# Patient Record
Sex: Female | Born: 1985 | Hispanic: Yes | Smoking: Current every day smoker
Health system: Southern US, Community
[De-identification: ages and names within clinical notes are randomized; demographics above are authoritative.]

## PROBLEM LIST (undated history)

## (undated) DIAGNOSIS — F419 Anxiety disorder, unspecified: Secondary | ICD-10-CM

## (undated) DIAGNOSIS — F319 Bipolar disorder, unspecified: Secondary | ICD-10-CM

## (undated) DIAGNOSIS — O99345 Other mental disorders complicating the puerperium: Secondary | ICD-10-CM

## (undated) DIAGNOSIS — F909 Attention-deficit hyperactivity disorder, unspecified type: Secondary | ICD-10-CM

## (undated) DIAGNOSIS — E0590 Thyrotoxicosis, unspecified without thyrotoxic crisis or storm: Secondary | ICD-10-CM

## (undated) DIAGNOSIS — F53 Postpartum depression: Secondary | ICD-10-CM

## (undated) DIAGNOSIS — F329 Major depressive disorder, single episode, unspecified: Secondary | ICD-10-CM

## (undated) DIAGNOSIS — E039 Hypothyroidism, unspecified: Secondary | ICD-10-CM

## (undated) DIAGNOSIS — F32A Depression, unspecified: Secondary | ICD-10-CM

## (undated) HISTORY — DX: Bipolar disorder, unspecified: F31.9

## (undated) HISTORY — DX: Major depressive disorder, single episode, unspecified: F32.9

## (undated) HISTORY — PX: COSMETIC SURGERY: SHX468

## (undated) HISTORY — PX: RHINOPLASTY: SUR1284

## (undated) HISTORY — PX: ADENOIDECTOMY: SUR15

## (undated) HISTORY — DX: Postpartum depression: F53.0

## (undated) HISTORY — DX: Attention-deficit hyperactivity disorder, unspecified type: F90.9

## (undated) HISTORY — DX: Depression, unspecified: F32.A

## (undated) HISTORY — DX: Other mental disorders complicating the puerperium: O99.345

## (undated) HISTORY — DX: Hypothyroidism, unspecified: E03.9

## (undated) HISTORY — DX: Anxiety disorder, unspecified: F41.9

## (undated) HISTORY — DX: Thyrotoxicosis, unspecified without thyrotoxic crisis or storm: E05.90

---

## 2014-06-13 ENCOUNTER — Encounter: Payer: Self-pay | Admitting: Obstetrics & Gynecology

## 2014-06-20 ENCOUNTER — Encounter: Payer: Self-pay | Admitting: Obstetrics & Gynecology

## 2014-08-26 ENCOUNTER — Encounter: Payer: Self-pay | Admitting: Obstetrics & Gynecology

## 2014-08-26 ENCOUNTER — Ambulatory Visit (INDEPENDENT_AMBULATORY_CARE_PROVIDER_SITE_OTHER): Payer: BLUE CROSS/BLUE SHIELD | Admitting: Obstetrics & Gynecology

## 2014-08-26 VITALS — BP 116/77 | HR 77 | Resp 16 | Ht 63.0 in | Wt 172.0 lb

## 2014-08-26 DIAGNOSIS — Z01419 Encounter for gynecological examination (general) (routine) without abnormal findings: Secondary | ICD-10-CM | POA: Diagnosis not present

## 2014-08-26 DIAGNOSIS — Z113 Encounter for screening for infections with a predominantly sexual mode of transmission: Secondary | ICD-10-CM

## 2014-08-26 DIAGNOSIS — Z3200 Encounter for pregnancy test, result unknown: Secondary | ICD-10-CM

## 2014-08-26 DIAGNOSIS — Z124 Encounter for screening for malignant neoplasm of cervix: Secondary | ICD-10-CM

## 2014-08-26 DIAGNOSIS — Z1151 Encounter for screening for human papillomavirus (HPV): Secondary | ICD-10-CM | POA: Diagnosis not present

## 2014-08-26 DIAGNOSIS — Z0000 Encounter for general adult medical examination without abnormal findings: Secondary | ICD-10-CM

## 2014-08-26 DIAGNOSIS — N910 Primary amenorrhea: Secondary | ICD-10-CM

## 2014-08-26 LAB — POCT URINE PREGNANCY: Preg Test, Ur: NEGATIVE

## 2014-08-26 NOTE — Progress Notes (Signed)
Subjective:    Isabella Marks is a 29 y.o. engaged P65 (90 yo biological son and 1 stepson) female who presents for an annual exam. The patient has no complaints today except that she has been trying to conceive for a year with no pregnancy. Her fiance has never fathered any children. The patient is sexually active. GYN screening history: last pap: was normal. The patient wears seatbelts: yes. The patient participates in regular exercise: yes. Has the patient ever been transfused or tattooed?: yes. The patient reports that there is not domestic violence in her life.   Menstrual History: OB History    Gravida Para Term Preterm AB TAB SAB Ectopic Multiple Living   1 1 1       1       Menarche age: 44  Patient's last menstrual period was 07/16/2014.    The following portions of the patient's history were reviewed and updated as appropriate: allergies, current medications, past family history, past medical history, past social history, past surgical history and problem list.  Review of Systems A comprehensive review of systems was negative. She is a Agricultural engineer, previous Company secretary. Denies STIs.   Objective:    BP 116/77 mmHg  Pulse 77  Resp 16  Ht 5\' 3"  (1.6 m)  Wt 172 lb (78.019 kg)  BMI 30.48 kg/m2  LMP 07/16/2014  General Appearance:    Alert, cooperative, no distress, appears stated age  Head:    Normocephalic, without obvious abnormality, atraumatic  Eyes:    PERRL, conjunctiva/corneas clear, EOM's intact, fundi    benign, both eyes  Ears:    Normal TM's and external ear canals, both ears  Nose:   Nares normal, septum midline, mucosa normal, no drainage    or sinus tenderness  Throat:   Lips, mucosa, and tongue normal; teeth and gums normal  Neck:   Supple, symmetrical, trachea midline, no adenopathy;    thyroid:  no enlargement/tenderness/nodules; no carotid   bruit or JVD  Back:     Symmetric, no curvature, ROM normal, no CVA tenderness  Lungs:     Clear to auscultation bilaterally,  respirations unlabored  Chest Wall:    No tenderness or deformity   Heart:    Regular rate and rhythm, S1 and S2 normal, no murmur, rub   or gallop  Breast Exam:    No tenderness, masses, or nipple abnormality  Abdomen:     Soft, non-tender, bowel sounds active all four quadrants,    no masses, no organomegaly  Genitalia:    Normal female without lesion, discharge or tenderness, slightly enlarged, NT, mobile, normal adnexal exam     Extremities:   Extremities normal, atraumatic, no cyanosis or edema  Pulses:   2+ and symmetric all extremities  Skin:   Skin color, texture, turgor normal, no rashes or lesions  Lymph nodes:   Cervical, supraclavicular, and axillary nodes normal  Neurologic:   CNII-XII intact, normal strength, sensation and reflexes    throughout  .    Assessment:    Healthy female exam.   ? Faintly positive UPT along with late period and slightly enlarged uterus   Plan:     Chlamydia specimen. GC specimen. Thin prep Pap smear. QBHCG   Rec MVI daily

## 2014-08-27 LAB — HCG, QUANTITATIVE, PREGNANCY: hCG, Beta Chain, Quant, S: <2 m[IU]/mL

## 2014-08-29 LAB — CYTOLOGY - PAP

## 2015-01-30 ENCOUNTER — Encounter: Payer: Self-pay | Admitting: Obstetrics & Gynecology

## 2015-01-30 ENCOUNTER — Ambulatory Visit (INDEPENDENT_AMBULATORY_CARE_PROVIDER_SITE_OTHER): Payer: Commercial Managed Care - HMO | Admitting: Obstetrics & Gynecology

## 2015-01-30 VITALS — BP 126/88 | HR 107 | Ht 63.0 in | Wt 183.0 lb

## 2015-01-30 DIAGNOSIS — N912 Amenorrhea, unspecified: Secondary | ICD-10-CM

## 2015-01-30 DIAGNOSIS — Z23 Encounter for immunization: Secondary | ICD-10-CM

## 2015-01-30 MED ORDER — MEDROXYPROGESTERONE ACETATE 10 MG PO TABS: 10.0000 mg | ORAL_TABLET | Freq: Every day | ORAL | Status: DC

## 2015-01-30 NOTE — Progress Notes (Signed)
   Subjective:    Patient ID: Isabella Marks, female    DOB: [birth year], 29 y.o.   MRN: [id number]  HPI 29 yo engaged young woman who was here for an annual exam 4/16. She wants to conceive but started lithium June 2016. LMP was March 2016. She usually has regular monthly periods. She has taken UPTs at home and they are negative.   Review of Systems     Objective:   Physical Exam [id number] Breathing, conversing, and ambulating normally       Assessment & Plan:  Amenorrhea for 3 months- check TSH, prolactin, and give a trial of provera cyclic Rec MVI We discussed that Lithium is Category D in pregnancy (risks of heart defects) Flu vaccine today

## 2015-01-31 LAB — TSH: TSH: 2.584 u[IU]/mL (ref 0.350–4.500)

## 2015-01-31 LAB — PROLACTIN: Prolactin: 5.3 ng/mL

## 2015-04-01 ENCOUNTER — Ambulatory Visit: Payer: Commercial Managed Care - HMO | Admitting: Obstetrics & Gynecology

## 2015-07-09 ENCOUNTER — Ambulatory Visit (INDEPENDENT_AMBULATORY_CARE_PROVIDER_SITE_OTHER): Payer: Commercial Managed Care - HMO | Admitting: Obstetrics & Gynecology

## 2015-07-09 ENCOUNTER — Encounter: Payer: Self-pay | Admitting: Osteopathic Medicine

## 2015-07-09 ENCOUNTER — Ambulatory Visit (INDEPENDENT_AMBULATORY_CARE_PROVIDER_SITE_OTHER): Payer: Commercial Managed Care - HMO | Admitting: Osteopathic Medicine

## 2015-07-09 ENCOUNTER — Encounter: Payer: Self-pay | Admitting: Obstetrics & Gynecology

## 2015-07-09 VITALS — BP 118/69 | HR 79 | Temp 98.3°F | Ht 63.0 in | Wt 183.0 lb

## 2015-07-09 VITALS — BP 108/71 | HR 79 | Temp 97.2°F | Resp 16 | Ht 63.0 in | Wt 182.0 lb

## 2015-07-09 DIAGNOSIS — R05 Cough: Secondary | ICD-10-CM | POA: Diagnosis not present

## 2015-07-09 DIAGNOSIS — J019 Acute sinusitis, unspecified: Secondary | ICD-10-CM

## 2015-07-09 DIAGNOSIS — E079 Disorder of thyroid, unspecified: Secondary | ICD-10-CM | POA: Insufficient documentation

## 2015-07-09 DIAGNOSIS — N644 Mastodynia: Secondary | ICD-10-CM | POA: Diagnosis not present

## 2015-07-09 DIAGNOSIS — F319 Bipolar disorder, unspecified: Secondary | ICD-10-CM | POA: Insufficient documentation

## 2015-07-09 DIAGNOSIS — Z0000 Encounter for general adult medical examination without abnormal findings: Secondary | ICD-10-CM

## 2015-07-09 DIAGNOSIS — F17200 Nicotine dependence, unspecified, uncomplicated: Secondary | ICD-10-CM | POA: Insufficient documentation

## 2015-07-09 DIAGNOSIS — F909 Attention-deficit hyperactivity disorder, unspecified type: Secondary | ICD-10-CM | POA: Insufficient documentation

## 2015-07-09 DIAGNOSIS — R059 Cough, unspecified: Secondary | ICD-10-CM

## 2015-07-09 DIAGNOSIS — H6983 Other specified disorders of Eustachian tube, bilateral: Secondary | ICD-10-CM

## 2015-07-09 DIAGNOSIS — E039 Hypothyroidism, unspecified: Secondary | ICD-10-CM | POA: Insufficient documentation

## 2015-07-09 MED ORDER — AMOXICILLIN-POT CLAVULANATE 875-125 MG PO TABS: 1.0000 | ORAL_TABLET | Freq: Two times a day (BID) | ORAL | Status: DC

## 2015-07-09 MED ORDER — IPRATROPIUM BROMIDE 0.03 % NA SOLN: 2.0000 | Freq: Two times a day (BID) | NASAL | Status: DC

## 2015-07-09 MED ORDER — BENZONATATE 200 MG PO CAPS: 200.0000 mg | ORAL_CAPSULE | Freq: Three times a day (TID) | ORAL | Status: DC | PRN

## 2015-07-09 NOTE — Patient Instructions (Signed)

## 2015-07-09 NOTE — Progress Notes (Signed)
HPI: Isabella Marks is a 30 y.o. female who presents to [facility]  today for chief complaint of:  Chief Complaint  Patient presents with  . Establish Care    "i've been sick for 2 months"   ILLNESS . Location: sinus . Quality: coughing, dizziness . Assoc signs/symptoms: fever temp low 100's just started with cough and runny nose . Duration: 2 months . Modifying factors: has tried the following OTC/Rx medications: Robitussin, Delsym, Aleve  without relief . Context: LAST MONTH HAD FREQUENT GI PROBLEMS, THROWING UP AND DIARRHEA, PAST FEW WEEKS BAD COUGH, RUNNY NOSE, HEADACHES, FEVER, DIZZINESS.   OTHER MEDICAL ISSUES - BRIEF REVIEW Under care of psychiatry Dr. Elaina Hoops MD [phone] - well-controlled   Past Medical History  Diagnosis Date  . Bipolar 1 disorder ([place])   . Depression   . Postpartum depression   . ADHD (attention deficit hyperactivity disorder)   . Hyperthyroidism   . Hypothyroid    Outpatient Encounter Prescriptions as of 07/09/2015  Medication Sig Note  . amphetamine-dextroamphetamine (ADDERALL) 30 MG tablet Take 1 tablet by mouth 2 (two) times daily. 08/26/2014: Received from: External Pharmacy  . FLUoxetine (PROZAC) 20 MG tablet Take 20 mg by mouth daily.   Marland Kitchen lithium 300 MG tablet  08/26/2014: Received from: External Pharmacy  . lithium carbonate 300 MG capsule  08/26/2014: Received from: External Pharmacy  . zolpidem (AMBIEN) 10 MG tablet Take 10 mg by mouth at bedtime as needed for sleep.    No facility-administered encounter medications on file as of 07/09/2015.   Family History  Problem Relation Age of Onset  . Breast cancer Paternal Grandmother   . Breast cancer Maternal Aunt   . Lupus Maternal Aunt   . Diabetes Paternal Aunt   . Hypertension Father    Past Surgical History  Procedure Laterality Date  . Rhinoplasty    . Adenoidectomy     Social History   Social History  . Marital Status: Single    Spouse  Name: N/A  . Number of Children: N/A  . Years of Education: N/A   Occupational History  . Not on file.   Social History Main Topics  . Smoking status: Current Every Day Smoker -- 0.50 packs/day for 3 years    Types: Cigarettes  . Smokeless tobacco: Never Used  . Alcohol Use: 0.0 oz/week    0 Standard drinks or equivalent per week     Comment: Occasional  . Drug Use: No  . Sexual Activity:    Partners: Male   Other Topics Concern  . Not on file   Social History Narrative     Review of Systems: CONSTITUTIONAL:  No  fever, no chills, No  unintentional weight changes, (+) generalized weakness/fatigue HEAD/EYES/EARS/NOSE/THROAT: No  headache, no vision change, (+) hearing change, No  sore throat, No  sinus pressure, (+) Hay fever/allergies CARDIAC: No  chest pain, No  pressure, No palpitations, No  orthopnea RESPIRATORY: (+)  cough, No  shortness of breath/wheeze GASTROINTESTINAL: No  nausea, No  vomiting, No  abdominal pain, No  blood in stool, No  diarrhea, No  constipation  MUSCULOSKELETAL: No  myalgia/arthralgia GENITOURINARY: No  incontinence, No  abnormal genital bleeding/discharge SKIN: (+) rash/wounds/concerning lesions HEM/ONC: No  easy bruising/bleeding, No  abnormal lymph node ENDOCRINE: No polyuria/polydipsia/polyphagia, No  heat/cold intolerance  NEUROLOGIC: No  weakness, No  dizziness, No  slurred speech PSYCHIATRIC: (+) concerns with depression, (+) concerns with anxiety, (+) sleep problems, PHQ2 (+)  Exam:  BP 118/69 mmHg  Pulse 79  Temp(Src) 98.3 F (36.8 C) (Oral)  Ht 5\' 3"  (1.6 m)  Wt 183 lb (83.008 kg)  BMI 32.43 kg/m2  LMP 06/22/2015 Constitutional: VSS, see above. General Appearance: alert, well-developed, well-nourished, NAD Eyes: Normal lids and conjunctive, non-icteric sclera, PERRLA Ears, Nose, Mouth, Throat: Normal external inspection ears/nares/mouth/lips/gums, mild clear effusion b/l but worse on L TM, MMM; posterior pharynx with  erythema, without exudate, (+) nasal congestion and rhinorrhea Neck: No masses, trachea midline. No thyroid enlargement/tenderness/mass appreciated, normal lymph nodes Respiratory: Normal respiratory effort. No  wheeze/rhonchi/rales Cardiovascular: S1/S2 normal, no murmur/rub/gallop auscultated. RRR.       No lower extremity edema.    No results found for this or any previous visit (from the past 72 hour(s)). No results found.   ASSESSMENT/PLAN:   Acute sinusitis, recurrence not specified, unspecified location - Plan: amoxicillin-clavulanate (AUGMENTIN) 875-125 MG tablet, ipratropium (ATROVENT) 0.03 % nasal spray  Cough - likely postnasal drip - Plan: benzonatate (TESSALON) 200 MG capsule  Tobacco dependence  Eustachian tube dysfunction, bilateral - likely cause of dizziness due to sinus congestion/sinusitis - treat the cause, pt to alert me if no better on 1 week treatment   Return if symptoms worsen or fail to improve, and when due for annual wellness/physical.

## 2015-07-09 NOTE — Progress Notes (Signed)
   Subjective:    Patient ID: Isabella Marks, female    DOB: 02-Aug-1985, 30 y.o.   MRN: [id number]  HPI 30 yo W G0 here with some intermittent left breast pain (lower area) with a questionable mass.  Review of Systems She did get a period after the provera and is having a period every other month now spontaneously.    Objective:   Physical Exam WWHWFNAD Breathing, conversing normally Both breasts are entirely normal, dense tissue bilaterally and symmetrically       Assessment & Plan:  Reassurance given I have referred her to a fam md for general medical care

## 2018-11-29 LAB — RESULTS CONSOLE HPV: CHL HPV: NEGATIVE

## 2018-11-29 LAB — HM PAP SMEAR

## 2020-10-30 ENCOUNTER — Inpatient Hospital Stay (HOSPITAL_COMMUNITY)
Admission: AD | Admit: 2020-10-30 | Discharge: 2020-10-30 | Disposition: A | Discharge status: Home / Self Care | Payer: Managed Care, Other (non HMO) | Attending: Obstetrics and Gynecology | Admitting: Obstetrics and Gynecology

## 2020-10-30 ENCOUNTER — Ambulatory Visit: Payer: Managed Care, Other (non HMO) | Admitting: Obstetrics and Gynecology

## 2020-10-30 ENCOUNTER — Other Ambulatory Visit: Payer: Self-pay

## 2020-10-30 ENCOUNTER — Inpatient Hospital Stay (HOSPITAL_COMMUNITY): Payer: Managed Care, Other (non HMO)

## 2020-10-30 ENCOUNTER — Encounter: Payer: Self-pay | Admitting: Obstetrics and Gynecology

## 2020-10-30 ENCOUNTER — Encounter (HOSPITAL_COMMUNITY): Payer: Self-pay | Admitting: Obstetrics and Gynecology

## 2020-10-30 VITALS — BP 108/62 | HR 93 | Resp 16 | Ht 63.0 in | Wt 162.0 lb

## 2020-10-30 DIAGNOSIS — O26891 Other specified pregnancy related conditions, first trimester: Secondary | ICD-10-CM | POA: Diagnosis not present

## 2020-10-30 DIAGNOSIS — O209 Hemorrhage in early pregnancy, unspecified: Secondary | ICD-10-CM | POA: Diagnosis present

## 2020-10-30 DIAGNOSIS — R1031 Right lower quadrant pain: Secondary | ICD-10-CM | POA: Diagnosis not present

## 2020-10-30 DIAGNOSIS — O3680X0 Pregnancy with inconclusive fetal viability, not applicable or unspecified: Secondary | ICD-10-CM

## 2020-10-30 DIAGNOSIS — R102 Pelvic and perineal pain: Secondary | ICD-10-CM | POA: Diagnosis not present

## 2020-10-30 DIAGNOSIS — Z3201 Encounter for pregnancy test, result positive: Secondary | ICD-10-CM | POA: Diagnosis not present

## 2020-10-30 DIAGNOSIS — O26899 Other specified pregnancy related conditions, unspecified trimester: Secondary | ICD-10-CM

## 2020-10-30 DIAGNOSIS — F17210 Nicotine dependence, cigarettes, uncomplicated: Secondary | ICD-10-CM | POA: Diagnosis not present

## 2020-10-30 DIAGNOSIS — N939 Abnormal uterine and vaginal bleeding, unspecified: Secondary | ICD-10-CM

## 2020-10-30 DIAGNOSIS — O4691 Antepartum hemorrhage, unspecified, first trimester: Secondary | ICD-10-CM

## 2020-10-30 DIAGNOSIS — Z20822 Contact with and (suspected) exposure to covid-19: Secondary | ICD-10-CM | POA: Diagnosis not present

## 2020-10-30 DIAGNOSIS — Z3200 Encounter for pregnancy test, result unknown: Secondary | ICD-10-CM

## 2020-10-30 DIAGNOSIS — O99331 Smoking (tobacco) complicating pregnancy, first trimester: Secondary | ICD-10-CM | POA: Diagnosis not present

## 2020-10-30 DIAGNOSIS — Z3A01 Less than 8 weeks gestation of pregnancy: Secondary | ICD-10-CM | POA: Diagnosis not present

## 2020-10-30 LAB — COMPREHENSIVE METABOLIC PANEL
ALT: 50 U/L — ABNORMAL HIGH (ref 0–44)
AST: 43 U/L — ABNORMAL HIGH (ref 15–41)
Albumin: 3.9 g/dL (ref 3.5–5.0)
Alkaline Phosphatase: 63 U/L (ref 38–126)
Anion gap: 4 — ABNORMAL LOW (ref 5–15)
BUN: 8 mg/dL (ref 6–20)
CO2: 24 mmol/L (ref 22–32)
Calcium: 8.9 mg/dL (ref 8.9–10.3)
Chloride: 107 mmol/L (ref 98–111)
Creatinine, Ser: 0.71 mg/dL (ref 0.44–1.00)
GFR, Estimated: >60 mL/min (ref >60)
Glucose, Bld: 109 mg/dL — ABNORMAL HIGH (ref 70–99)
Potassium: 4.1 mmol/L (ref 3.5–5.1)
Sodium: 135 mmol/L (ref 135–145)
Total Bilirubin: 0.7 mg/dL (ref 0.3–1.2)
Total Protein: 6.7 g/dL (ref 6.5–8.1)

## 2020-10-30 LAB — CBC
HCT: 41.9 % (ref 36.0–46.0)
Hemoglobin: 14.1 g/dL (ref 12.0–15.0)
MCH: 31.5 pg (ref 26.0–34.0)
MCHC: 33.7 g/dL (ref 30.0–36.0)
MCV: 93.7 fL (ref 80.0–100.0)
Platelets: 200 10*3/uL (ref 150–400)
RBC: 4.47 MIL/uL (ref 3.87–5.11)
RDW: 12.3 % (ref 11.5–15.5)
WBC: 8.6 10*3/uL (ref 4.0–10.5)
nRBC: 0 % (ref 0.0–0.2)

## 2020-10-30 LAB — POCT URINE PREGNANCY: Preg Test, Ur: POSITIVE — AB

## 2020-10-30 LAB — TYPE AND SCREEN
ABO/RH(D): O POS
Antibody Screen: NEGATIVE

## 2020-10-30 LAB — RESP PANEL BY RT-PCR (FLU A&B, COVID) ARPGX2
Influenza A by PCR: NEGATIVE
Influenza B by PCR: NEGATIVE
SARS Coronavirus 2 by RT PCR: NEGATIVE

## 2020-10-30 LAB — HCG, QUANTITATIVE, PREGNANCY: hCG, Beta Chain, Quant, S: 849 m[IU]/mL — ABNORMAL HIGH (ref <5)

## 2020-10-30 MED ORDER — LACTATED RINGERS IV BOLUS
1000.0000 mL | Freq: Once | INTRAVENOUS | Status: AC
  Administered 2020-10-30: 1000 mL via INTRAVENOUS

## 2020-10-30 MED ORDER — MORPHINE SULFATE (PF) 4 MG/ML IV SOLN
2.0000 mg | Freq: Once | INTRAVENOUS | Status: AC
Start: 2020-10-30 — End: 2020-10-30
  Administered 2020-10-30: 2 mg via INTRAVENOUS
  Filled 2020-10-30: qty 1

## 2020-10-30 NOTE — Progress Notes (Signed)
GYNECOLOGY OFFICE VISIT NOTE  History:  35 y.o. G1P1001 here today for abnormal uterine bleeding. She has been bleeding for 3 weeks, intermittently brown and red, will get lighter and heavier. She skipped her period last month. Has not taken a pregnancy test. She has irregular periods normally, and it is not uncommon for her to skip periods. She then had severe, 10/10 right lower quadrant pain 2 days ago that improved on its own and has been waxing and waning since but decided she should be seen. Today, rates it 8/10. Did not go to ED/urgent care because it improved on its own. Today, she describes it as "pulsating" and can feel the pain down her right leg.   Not on contraception as she and partner would like to get pregnant. Had CS 11 years ago.  Past Medical History:  Diagnosis Date   ADHD (attention deficit hyperactivity disorder)    Bipolar 1 disorder ([place])    Depression    Hyperthyroidism    Hypothyroid    Postpartum depression     Past Surgical History:  Procedure Laterality Date   ADENOIDECTOMY     RHINOPLASTY       Current Outpatient Medications:    amphetamine-dextroamphetamine (ADDERALL) 30 MG tablet, Take 1 tablet by mouth 2 (two) times daily., Disp: , Rfl: 0   lithium carbonate (ESKALITH) 450 MG CR tablet, TAKE 2 TABLETS BY MOUTH EVERY DAY AT BEDTIME, Disp: , Rfl:    zolpidem (AMBIEN) 10 MG tablet, Take 10 mg by mouth at bedtime as needed for sleep., Disp: , Rfl:   The following portions of the patient's history were reviewed and updated as appropriate: allergies, current medications, past family history, past medical history, past social history, past surgical history and problem list.   Review of Systems:  Pertinent items noted in HPI and remainder of comprehensive ROS otherwise negative.   Objective:  Physical Exam BP 108/62   Pulse 93   Resp 16   Ht 5\' 3"  (1.6 m)   Wt 162 lb (73.5 kg)   BMI 28.70 kg/m  CONSTITUTIONAL: Well-developed, well-nourished  female in no acute distress.  HENT:  Normocephalic, atraumatic. External right and left ear normal. Oropharynx is clear and moist EYES: Conjunctivae and EOM are normal. Pupils are equal, round, and reactive to light. No scleral icterus.  NECK: Normal range of motion, supple, no masses SKIN: Skin is warm and dry. No rash noted. Not diaphoretic. No erythema. No pallor. NEUROLOGIC: Alert and oriented to person, place, and time. Normal reflexes, muscle tone coordination. No cranial nerve deficit noted. PSYCHIATRIC: Normal mood and affect. Normal behavior. Normal judgment and thought content. CARDIOVASCULAR: Normal heart rate noted RESPIRATORY: Effort normal, no problems with respiration noted ABDOMEN: Soft, no distention noted. Moderately tender in RLQ   PELVIC: deferred MUSCULOSKELETAL: Normal range of motion. No edema noted.  Labs and Imaging UPT: positive  Assessment & Plan:   1. Unconfirmed pregnancy - informed patient of positive UPT, she had not taken a pregnancy test at home - concern for ectopic given ongoing bleeding and new onset, severe RLQ pain for the last two days - recommend immediate workup in MAU for rule out ectopic - reviewed ectopic pregnancy, prognosis, possibility of life-threatening complications, need for treatment and/or surgery if confirmed - reviewed may be early normal pregnancy as well but need workup to confirm - patient understandably emotional and verbalizes understanding of the above, is in agreement to go to MAU now - MAU staff aware - POCT  urine pregnancy  2. Pelvic pain  3. Abnormal uterine bleeding (AUB)   Routine preventative health maintenance measures emphasized. Please refer to After Visit Summary for other counseling recommendations.   Return if symptoms worsen or fail to improve.   Feliz Beam, MD, [place] for [employer] [facility])

## 2020-10-30 NOTE — MAU Provider Note (Signed)
History     [id number]  Arrival date and time: 10/30/20 1006    Chief Complaint  Patient presents with   Vaginal Bleeding     HPI Isabella Marks is a 35 y.o. at [redacted]w[redacted]d by uncertain LMP with PMHx notable for bipolar currently lithium, who presents for vaginal bleeding and abdominal pain in the setting of a positive pregnancy test.   Patient sent from [facility] where she was seen by Dr. Rosana Hoes earlier today. I received sign out from Dr. Rosana Hoes prior to her arrival.  Patient reports she has an irregular period and is unsure when her last true menses was. About three weeks ago she began having vaginal bleeding, at times it was heavy with large plum sized clots. Yesterday she developed severe RLQ abdominal pain so she made an appointment with [facility] today. She endorses having some nausea but has not had emesis. No fevers. Nothing has made pain better. Endorses some vaginal discharge, denies burning or pain with urination.    --/--/O POS (06/16 1037)  OB History     Gravida  2   Para  1   Term  1   Preterm      AB      Living  1      SAB      IAB      Ectopic      Multiple      Live Births  1           Past Medical History:  Diagnosis Date   ADHD (attention deficit hyperactivity disorder)    Bipolar 1 disorder ([facility])    Depression    Hyperthyroidism    Hypothyroid    Postpartum depression     Past Surgical History:  Procedure Laterality Date   ADENOIDECTOMY     RHINOPLASTY      Family History  Problem Relation Age of Onset   Breast cancer Paternal Grandmother    Breast cancer Maternal Aunt    Lupus Maternal Aunt    Diabetes Paternal Aunt    Hypertension Father     Social History   Socioeconomic History   Marital status: Single    Spouse name: Not on file   Number of children: Not on file   Years of education: Not on file   Highest education level: Not on file  Occupational History   Not on file  Tobacco Use   Smoking status: Every Day     Packs/day: 0.50    Years: 3.00    Pack years: 1.50    Types: Cigarettes   Smokeless tobacco: Never  Vaping Use   Vaping Use: Never used  Substance and Sexual Activity   Alcohol use: Not Currently    Comment: Occasional   Drug use: No   Sexual activity: Yes    Partners: Male    Birth control/protection: None  Other Topics Concern   Not on file  Social History Narrative   Not on file   Social Determinants of Health   Financial Resource Strain: Not on file  Food Insecurity: Not on file  Transportation Needs: Not on file  Physical Activity: Not on file  Stress: Not on file  Social Connections: Not on file  Intimate Partner Violence: Not on file    No Known Allergies  No current facility-administered medications on file prior to encounter.   Current Outpatient Medications on File Prior to Encounter  Medication Sig Dispense Refill   amphetamine-dextroamphetamine (ADDERALL) 30 MG tablet Take 1  tablet by mouth 2 (two) times daily.  0   lithium carbonate (ESKALITH) 450 MG CR tablet TAKE 2 TABLETS BY MOUTH EVERY DAY AT BEDTIME     zolpidem (AMBIEN) 10 MG tablet Take 10 mg by mouth at bedtime as needed for sleep.       ROS Pertinent positives and negative per HPI, all others reviewed and negative  Physical Exam   BP 124/65   Pulse 92   Temp 98.6 F (37 C)   Resp 18   Ht 5\' 3"  (1.6 m)   Wt 73.5 kg   LMP 09/11/2020 (Within Weeks)   BMI 28.70 kg/m   Patient Vitals for the past 24 hrs:  BP Temp Pulse Resp Height Weight  10/30/20 1307 124/65 -- 92 -- -- --  10/30/20 1138 117/69 -- 90 -- -- --  10/30/20 1024 117/65 98.6 F (37 C) 68 18 5\' 3"  (1.6 m) 73.5 kg    Physical Exam Vitals reviewed.  Constitutional:      General: She is not in acute distress.    Appearance: She is well-developed. She is not diaphoretic.  Eyes:     General: No scleral icterus. Pulmonary:     Effort: Pulmonary effort is normal. No respiratory distress.  Abdominal:     General:  There is no distension.     Palpations: Abdomen is soft.     Tenderness: There is abdominal tenderness. There is no guarding or rebound.     Comments: Mild ttp in RLQ without guarding or rebound  Skin:    General: Skin is warm and dry.  Neurological:     Mental Status: She is alert.     Coordination: Coordination normal.     Cervical Exam    Bedside Ultrasound Pt informed that the ultrasound is considered a limited OB ultrasound and is not intended to be a complete ultrasound exam.  Patient also informed that the ultrasound is not being completed with the intent of assessing for fetal or placental anomalies or any pelvic abnormalities.  Explained that the purpose of today's ultrasound is to assess for  viability.  Patient acknowledges the purpose of the exam and the limitations of the study.   My interpretation: On transabdominal bedside US no intrauterine structure seen. Left adnexa appears normal. Tubular mass present in area of R adnexa c/f ectopic.  FHT N/a  Labs Results for orders placed or performed during the hospital encounter of 10/30/20 (from the past 24 hour(s))  CBC     Status: None   Collection Time: 10/30/20 10:37 AM  Result Value Ref Range   WBC 8.6 4.0 - 10.5 K/uL   RBC 4.47 3.87 - 5.11 MIL/uL   Hemoglobin 14.1 12.0 - 15.0 g/dL   HCT 41.9 36.0 - 46.0 %   MCV 93.7 80.0 - 100.0 fL   MCH 31.5 26.0 - 34.0 pg   MCHC 33.7 30.0 - 36.0 g/dL   RDW 12.3 11.5 - 15.5 %   Platelets 200 150 - 400 K/uL   nRBC 0.0 0.0 - 0.2 %  Comprehensive metabolic panel     Status: Abnormal   Collection Time: 10/30/20 10:37 AM  Result Value Ref Range   Sodium 135 135 - 145 mmol/L   Potassium 4.1 3.5 - 5.1 mmol/L   Chloride 107 98 - 111 mmol/L   CO2 24 22 - 32 mmol/L   Glucose, Bld 109 (H) 70 - 99 mg/dL   BUN 8 6 - 20 mg/dL   Creatinine,  Ser 0.71 0.44 - 1.00 mg/dL   Calcium 8.9 8.9 - 10.3 mg/dL   Total Protein 6.7 6.5 - 8.1 g/dL   Albumin 3.9 3.5 - 5.0 g/dL   AST 43 (H) 15 - 41 U/L    ALT 50 (H) 0 - 44 U/L   Alkaline Phosphatase 63 38 - 126 U/L   Total Bilirubin 0.7 0.3 - 1.2 mg/dL   GFR, Estimated >60 >60 mL/min   Anion gap 4 (L) 5 - 15  Type and screen [place]     Status: None   Collection Time: 10/30/20 10:37 AM  Result Value Ref Range   ABO/RH(D) O POS    Antibody Screen NEG    Sample Expiration      11/02/2020,2359 Performed at [facility], [place] [address], [place], [place] [address]   hCG, quantitative, pregnancy     Status: Abnormal   Collection Time: 10/30/20 10:37 AM  Result Value Ref Range   hCG, Beta Chain, Quant, S 849 (H) <5 mIU/mL  Resp Panel by RT-PCR (Flu A&B, Covid) Nasopharyngeal Swab     Status: None   Collection Time: 10/30/20 11:12 AM   Specimen: Nasopharyngeal Swab; Nasopharyngeal(NP) swabs in vial transport medium  Result Value Ref Range   SARS Coronavirus 2 by RT PCR NEGATIVE NEGATIVE   Influenza A by PCR NEGATIVE NEGATIVE   Influenza B by PCR NEGATIVE NEGATIVE    Imaging US OB LESS THAN 14 WEEKS WITH OB TRANSVAGINAL  Result Date: 10/30/2020 CLINICAL DATA:  Cramping, bleeding EXAM: OBSTETRIC <14 WK [place] AND TRANSVAGINAL OB US TECHNIQUE: Both transabdominal and transvaginal ultrasound examinations were performed for complete evaluation of the gestation as well as the maternal uterus, adnexal regions, and pelvic cul-de-sac. Transvaginal technique was performed to assess early pregnancy. COMPARISON:  None. FINDINGS: Intrauterine gestational sac: None Yolk sac:  Not Visualized. Embryo:  Not Visualized. Cardiac Activity: Not Visualized. Heart Rate: Not applicable Endometrial thickness 5 mm. Subchorionic hemorrhage:  None visualized. Maternal uterus/adnexae: Normal sonographic appearance of the right and left ovaries. There is no evidence of adnexal mass. There is trace free fluid in the pelvis which appears as simple fluid, likely physiologic. IMPRESSION: Pregnancy of unknown location. Recommend trending beta hCG and  close clinical follow-up with OB-GYN. Electronically Signed   By: Maurine Simmering   On: 10/30/2020 12:44    MAU Course  Procedures Lab Orders  Resp Panel by RT-PCR (Flu A&B, Covid) Nasopharyngeal Swab  CBC  Comprehensive metabolic panel  hCG, quantitative, pregnancy  Meds ordered this encounter  Medications   lactated ringers bolus 1,000 mL   morphine 4 MG/ML injection 2 mg   Imaging Orders  US OB LESS THAN 14 WEEKS WITH OB TRANSVAGINAL   MDM high  Assessment and Plan  #Vaginal bleeding, pregnancy #RLQ abdominal pain #Pregnancy of unknown location Patient presented w abdominal pain, she is now significantly improved after fluids and IV pain medications. Workup notable for normal CBC without white count or anemia. HCG 849 and TVUS without suspicious features. Discussed various possibilities with patient including ectopic/failed pregnancy, normal pregnancy, and miscarriage. Discussed that the only way to determine this is through trending hcg. Reviewed ectopic precautions in detail, in particular heavy bleeding, severe abdominal pain, and fever, and emphasized that ruptured ectopic can be a life threatening condition and not to delay returning for care if she developed warning signs. All questions answered, patient discharged to home in stable condition.   Clarnce Flock, MD/MPH 10/30/20  5:28 PM  Allergies as of 10/30/2020   No Known Allergies      Medication List     TAKE these medications    amphetamine-dextroamphetamine 30 MG tablet Commonly known as: ADDERALL Take 1 tablet by mouth 2 (two) times daily.   lithium carbonate 450 MG CR tablet Commonly known as: ESKALITH TAKE 2 TABLETS BY MOUTH EVERY DAY AT BEDTIME   zolpidem 10 MG tablet Commonly known as: AMBIEN Take 10 mg by mouth at bedtime as needed for sleep.

## 2020-10-30 NOTE — MAU Note (Signed)
Pt reports she started having abd cramping and bleeding yesterday. Pain has gotten worse  and is mostly on the right side. Bleeding was heavier yesterday but still reports bleeding today.

## 2020-11-01 ENCOUNTER — Other Ambulatory Visit: Payer: Self-pay

## 2020-11-01 ENCOUNTER — Inpatient Hospital Stay (HOSPITAL_COMMUNITY)
Admission: AD | Admit: 2020-11-01 | Discharge: 2020-11-01 | Disposition: A | Discharge status: Home / Self Care | Payer: Managed Care, Other (non HMO) | Attending: Family Medicine | Admitting: Family Medicine

## 2020-11-01 DIAGNOSIS — O99341 Other mental disorders complicating pregnancy, first trimester: Secondary | ICD-10-CM | POA: Insufficient documentation

## 2020-11-01 DIAGNOSIS — Z6790 Unspecified blood type, Rh positive: Secondary | ICD-10-CM

## 2020-11-01 DIAGNOSIS — O99281 Endocrine, nutritional and metabolic diseases complicating pregnancy, first trimester: Secondary | ICD-10-CM | POA: Diagnosis not present

## 2020-11-01 DIAGNOSIS — F319 Bipolar disorder, unspecified: Secondary | ICD-10-CM | POA: Diagnosis not present

## 2020-11-01 DIAGNOSIS — Z3A01 Less than 8 weeks gestation of pregnancy: Secondary | ICD-10-CM | POA: Diagnosis not present

## 2020-11-01 DIAGNOSIS — O3680X0 Pregnancy with inconclusive fetal viability, not applicable or unspecified: Secondary | ICD-10-CM | POA: Diagnosis not present

## 2020-11-01 DIAGNOSIS — F909 Attention-deficit hyperactivity disorder, unspecified type: Secondary | ICD-10-CM | POA: Diagnosis not present

## 2020-11-01 LAB — COMPREHENSIVE METABOLIC PANEL
ALT: 38 U/L (ref 0–44)
AST: 23 U/L (ref 15–41)
Albumin: 3.6 g/dL (ref 3.5–5.0)
Alkaline Phosphatase: 68 U/L (ref 38–126)
Anion gap: 5 (ref 5–15)
BUN: 7 mg/dL (ref 6–20)
CO2: 26 mmol/L (ref 22–32)
Calcium: 9 mg/dL (ref 8.9–10.3)
Chloride: 107 mmol/L (ref 98–111)
Creatinine, Ser: 0.67 mg/dL (ref 0.44–1.00)
GFR, Estimated: >60 mL/min (ref >60)
Glucose, Bld: 107 mg/dL — ABNORMAL HIGH (ref 70–99)
Potassium: 4.6 mmol/L (ref 3.5–5.1)
Sodium: 138 mmol/L (ref 135–145)
Total Bilirubin: 0.4 mg/dL (ref 0.3–1.2)
Total Protein: 6.5 g/dL (ref 6.5–8.1)

## 2020-11-01 LAB — CBC
HCT: 43.9 % (ref 36.0–46.0)
Hemoglobin: 14.6 g/dL (ref 12.0–15.0)
MCH: 31.5 pg (ref 26.0–34.0)
MCHC: 33.3 g/dL (ref 30.0–36.0)
MCV: 94.6 fL (ref 80.0–100.0)
Platelets: 199 10*3/uL (ref 150–400)
RBC: 4.64 MIL/uL (ref 3.87–5.11)
RDW: 12.5 % (ref 11.5–15.5)
WBC: 4.5 10*3/uL (ref 4.0–10.5)
nRBC: 0 % (ref 0.0–0.2)

## 2020-11-01 LAB — HCG, QUANTITATIVE, PREGNANCY: hCG, Beta Chain, Quant, S: 877 m[IU]/mL — ABNORMAL HIGH (ref <5)

## 2020-11-01 MED ORDER — METHOTREXATE FOR ECTOPIC PREGNANCY
50.0000 mg/m2 | Freq: Once | INTRAMUSCULAR | Status: AC
  Administered 2020-11-01: 90.5 mg via INTRAMUSCULAR
  Filled 2020-11-01: qty 3.62

## 2020-11-01 NOTE — MAU Provider Note (Signed)
[name]  is a 35 y.o. G2P1001 at [redacted]w[redacted]d who presents to MAU today for follow-up quant hCG after 48 hours. The patient was seen in MAU on 6/16 and had quant hCG of 849 and US showed no IUP. She denies pain. Reports small mat of VB.   OB History  Gravida Para Term Preterm AB Living  2 1 1     1   SAB IAB Ectopic Multiple Live Births          1    # Outcome Date GA Lbr Len/2nd Weight Sex Delivery Anes PTL Lv  2 Current           1 Term 05/03/09 [redacted]w[redacted]d  3232 g M CS-Unspec   LIV    Past Medical History:  Diagnosis Date   ADHD (attention deficit hyperactivity disorder)    Bipolar 1 disorder ([place])    Depression    Hyperthyroidism    Hypothyroid    Postpartum depression     ROS: + VB no pain  BP 127/72 (BP Location: Right Arm)   Pulse 63   Temp 98.3 F (36.8 C) (Oral)   Resp 20   Ht 5\' 3"  (1.6 m)   Wt 73.6 kg   LMP 09/11/2020 (Within Weeks)   SpO2 100%   BMI 28.73 kg/m   CONSTITUTIONAL: Well-developed, well-nourished female in no acute distress.  MUSCULOSKELETAL: Normal range of motion.  CARDIOVASCULAR: Regular heart rate RESPIRATORY: Normal effort NEUROLOGICAL: Alert and oriented to person, place, and time.  SKIN: Not diaphoretic. No erythema. No pallor. PSYCH: Normal mood and affect. Normal behavior. Normal judgment and thought content.  Results for orders placed or performed during the hospital encounter of 11/01/20 (from the past 24 hour(s))  hCG, quantitative, pregnancy     Status: Abnormal   Collection Time: 11/01/20 10:43 AM  Result Value Ref Range   hCG, Beta Chain, Quant, S 877 (H) <5 mIU/mL  Comprehensive metabolic panel     Status: Abnormal   Collection Time: 11/01/20 12:57 PM  Result Value Ref Range   Sodium 138 135 - 145 mmol/L   Potassium 4.6 3.5 - 5.1 mmol/L   Chloride 107 98 - 111 mmol/L   CO2 26 22 - 32 mmol/L   Glucose, Bld 107 (H) 70 - 99 mg/dL   BUN 7 6 - 20 mg/dL   Creatinine, Ser 0.67 0.44 - 1.00 mg/dL   Calcium 9.0 8.9 - 10.3 mg/dL    Total Protein 6.5 6.5 - 8.1 g/dL   Albumin 3.6 3.5 - 5.0 g/dL   AST 23 15 - 41 U/L   ALT 38 0 - 44 U/L   Alkaline Phosphatase 68 38 - 126 U/L   Total Bilirubin 0.4 0.3 - 1.2 mg/dL   GFR, Estimated >60 >60 mL/min   Anion gap 5 5 - 15  CBC     Status: None   Collection Time: 11/01/20 12:57 PM  Result Value Ref Range   WBC 4.5 4.0 - 10.5 K/uL   RBC 4.64 3.87 - 5.11 MIL/uL   Hemoglobin 14.6 12.0 - 15.0 g/dL   HCT 43.9 36.0 - 46.0 %   MCV 94.6 80.0 - 100.0 fL   MCH 31.5 26.0 - 34.0 pg   MCHC 33.3 30.0 - 36.0 g/dL   RDW 12.5 11.5 - 15.5 %   Platelets 199 150 - 400 K/uL   nRBC 0.0 0.0 - 0.2 %    MDM: Abnormal rise in qhcg. Consult with Dr. Kennon Rounds, recommends either  MTX today or rpt qhcg in 2 days. Discussed results and recommendations with pt and partner. She prefers mngt with MTX. Labs ordered.  The risks of methotrexate were reviewed including failure requiring repeat dosing or eventual surgery. She understands that methotrexate involves frequent return visits to monitor lab values and that she remains at risk of ectopic rupture until her beta is less than assay. The patient opts to proceed with methotrexate. She has no history of hepatic or renal dysfunction, has normal BUN/Cr/LFT's/platelets. She is felt to be reliable for follow-up. Side effects of photosensitivity & GI upset were discussed. She knows to avoid direct sunlight and abstain from alcohol, aspirin and aspirin-like products for two weeks. She was counseled to discontinue any MVI with folic acid. She understands to follow up on D4 (6/21) and D7 (6/24) for repeat BHCG and was given the instruction sheet. Strict ectopic precautions were reviewed, the patient knows to call with any abdominal pain, vomiting, fainting, or any concerns with her health. Rh pos.  A: 1. Pregnancy, location unknown   2. Blood type, Rh positive    P: Discharge home First trimester/ectopic precautions discussed Patient will return for follow-up quant at  [facility] on 6/21 Patient may return to MAU as needed or if her condition were to change or worsen   Allergies as of 11/01/2020   No Known Allergies      Medication List     STOP taking these medications    amphetamine-dextroamphetamine 30 MG tablet Commonly known as: ADDERALL   zolpidem 10 MG tablet Commonly known as: AMBIEN       TAKE these medications    lithium carbonate 450 MG CR tablet Commonly known as: ESKALITH TAKE 2 TABLETS BY MOUTH EVERY DAY AT BEDTIME        Julianne Handler, CNM 11/01/2020 4:25 PM

## 2020-11-01 NOTE — MAU Note (Signed)
Presents for HCG level.  Reports small VB, no clots.

## 2020-11-04 ENCOUNTER — Ambulatory Visit (INDEPENDENT_AMBULATORY_CARE_PROVIDER_SITE_OTHER): Payer: Managed Care, Other (non HMO) | Admitting: *Deleted

## 2020-11-04 ENCOUNTER — Other Ambulatory Visit: Payer: Self-pay

## 2020-11-04 DIAGNOSIS — O209 Hemorrhage in early pregnancy, unspecified: Secondary | ICD-10-CM

## 2020-11-04 LAB — HCG, QUANTITATIVE, PREGNANCY: HCG, Total, QN: 647 m[IU]/mL

## 2020-11-04 NOTE — Progress Notes (Signed)
Pt here for STAT only BHCG per [facility]

## 2020-11-05 ENCOUNTER — Telehealth: Payer: Self-pay | Admitting: *Deleted

## 2020-11-05 NOTE — Telephone Encounter (Signed)
-----   Message from Julianne Handler, [place] sent at 11/05/2020 10:55 AM EDT ----- Normal fall in qhcg, consulted w/Dr. Roselie Awkward. Needs day 7 qhcg on 11/07/20.

## 2020-11-05 NOTE — Telephone Encounter (Signed)
LM on voicemail to have a rpt BHCG on 11/07/20 @ 9:00 per M Bhambri,CNM.  I explained that we would need to continue to monitor her levels until they reach <5.

## 2020-11-07 ENCOUNTER — Other Ambulatory Visit: Payer: Self-pay

## 2020-11-07 ENCOUNTER — Encounter: Payer: Self-pay | Admitting: Certified Nurse Midwife

## 2020-11-07 ENCOUNTER — Other Ambulatory Visit (INDEPENDENT_AMBULATORY_CARE_PROVIDER_SITE_OTHER): Payer: Managed Care, Other (non HMO) | Admitting: *Deleted

## 2020-11-07 ENCOUNTER — Telehealth: Payer: Self-pay | Admitting: Certified Nurse Midwife

## 2020-11-07 DIAGNOSIS — O009 Unspecified ectopic pregnancy without intrauterine pregnancy: Secondary | ICD-10-CM

## 2020-11-07 LAB — HCG, QUANTITATIVE, PREGNANCY: HCG, Total, QN: 477 m[IU]/mL

## 2020-11-07 NOTE — Telephone Encounter (Signed)
Consult with Dr. Harolyn Rutherford, appropriate day 7 drop in qhcg after MTX. Plan for weekly qhcg until neg. Pt informed of results and plan. She is feeling better, had pain, nausea, and VB the first few days after MTX. She will be out of town until 11/16/20 therefore will start on 11/18/20. Ectopic precautions reviewed. Staff notified of need for weekly labs.

## 2020-11-07 NOTE — Progress Notes (Addendum)
Pt here for rpt BHCG day 7 per M Bhambri,CNM.  Lab was sent STAT and will send to [facility] for review.  Pt states that she is still bleeding but it has slowed down.

## 2020-11-10 ENCOUNTER — Telehealth: Payer: Self-pay | Admitting: *Deleted

## 2020-11-10 NOTE — Telephone Encounter (Signed)
Left patient a message to call and schedule or MyChart message appointment for HCG on 11/18/2020.

## 2020-11-19 ENCOUNTER — Other Ambulatory Visit (INDEPENDENT_AMBULATORY_CARE_PROVIDER_SITE_OTHER): Payer: Managed Care, Other (non HMO) | Admitting: *Deleted

## 2020-11-19 ENCOUNTER — Other Ambulatory Visit: Payer: Self-pay

## 2020-11-19 ENCOUNTER — Telehealth: Payer: Self-pay | Admitting: *Deleted

## 2020-11-19 DIAGNOSIS — O039 Complete or unspecified spontaneous abortion without complication: Secondary | ICD-10-CM

## 2020-11-19 LAB — HCG, QUANTITATIVE, PREGNANCY: HCG, Total, QN: 27 m[IU]/mL

## 2020-11-19 NOTE — Progress Notes (Signed)
Pt here for repeat BHCG per M Bhambri,CNM.  Pt states that the bleeding has slowed down to where she is only changing pad about 2 times daily.  The bleeding has gone from red to brown.  Will notifiy pt with results.

## 2020-11-19 NOTE — Telephone Encounter (Signed)
Pt notified of decreasing BHCG to 27.  I explained that we soul need to follow the levels until they were < 5.  She will return next Wed for a repeat BHCG per Dr Gala Romney.

## 2020-11-26 ENCOUNTER — Other Ambulatory Visit (INDEPENDENT_AMBULATORY_CARE_PROVIDER_SITE_OTHER): Payer: Managed Care, Other (non HMO) | Admitting: *Deleted

## 2020-11-26 ENCOUNTER — Other Ambulatory Visit: Payer: Self-pay

## 2020-11-26 DIAGNOSIS — O009 Unspecified ectopic pregnancy without intrauterine pregnancy: Secondary | ICD-10-CM

## 2020-11-26 NOTE — Progress Notes (Signed)
F/U BHCG only.  Pt states she is having only staining at this time that is red/brown

## 2020-11-27 ENCOUNTER — Telehealth: Payer: Self-pay | Admitting: *Deleted

## 2020-11-27 LAB — HCG, QUANTITATIVE, PREGNANCY: HCG, Total, QN: 11 m[IU]/mL

## 2020-11-27 NOTE — Telephone Encounter (Signed)
-----   Message from Julianne Handler, [place] sent at 11/27/2020 10:44 AM EDT ----- Needs weekly until negative

## 2020-11-27 NOTE — Telephone Encounter (Signed)
Pt notified of falling BHCG and she will return in 1 week for a repeat.Marland Kitchen

## 2020-12-03 ENCOUNTER — Other Ambulatory Visit: Payer: Self-pay

## 2020-12-03 ENCOUNTER — Other Ambulatory Visit (INDEPENDENT_AMBULATORY_CARE_PROVIDER_SITE_OTHER): Payer: Managed Care, Other (non HMO)

## 2020-12-03 DIAGNOSIS — O039 Complete or unspecified spontaneous abortion without complication: Secondary | ICD-10-CM

## 2020-12-03 LAB — HCG, QUANTITATIVE, PREGNANCY: HCG, Total, QN: 3 m[IU]/mL

## 2020-12-03 NOTE — Progress Notes (Signed)
Pt here for repeat BHCG per Julianne Handler, CNM. Pt denies any pain. Pt states she is still having a very small amount of blood/brown discharge. Spoke with Noni Saupe, NP and she states that is okay as long as she has no pain. Pt was sent to lab for blood draw.

## 2021-10-21 ENCOUNTER — Ambulatory Visit (INDEPENDENT_AMBULATORY_CARE_PROVIDER_SITE_OTHER): Payer: Managed Care, Other (non HMO)

## 2021-10-21 ENCOUNTER — Ambulatory Visit: Payer: Managed Care, Other (non HMO) | Admitting: Podiatry

## 2021-10-21 DIAGNOSIS — M216X2 Other acquired deformities of left foot: Secondary | ICD-10-CM

## 2021-10-21 DIAGNOSIS — M216X1 Other acquired deformities of right foot: Secondary | ICD-10-CM | POA: Diagnosis not present

## 2021-10-21 DIAGNOSIS — Z01818 Encounter for other preprocedural examination: Secondary | ICD-10-CM | POA: Diagnosis not present

## 2021-10-21 DIAGNOSIS — Q666 Other congenital valgus deformities of feet: Secondary | ICD-10-CM | POA: Diagnosis not present

## 2021-10-21 DIAGNOSIS — M79671 Pain in right foot: Secondary | ICD-10-CM

## 2021-10-23 NOTE — Progress Notes (Signed)
Subjective:  Patient ID: Isabella Marks, female    DOB: Apr 25, 1986,  MRN: [id number]  Chief Complaint  Patient presents with   Plantar Warts    36 y.o. female presents with the above complaint.  Patient presents with left submetatarsal 3 right fifth metatarsal plantarflexed metatarsal head with underlying porokeratosis.  Patient states painful to touch is progressive gotten worse.  She wanted to have it removed she has been doing this for quite some time.  She is tried shoe gear modification offloading.  She states she would like to discuss surgical options at this time.  She has not seen anyone else prior to seeing me she denies any other acute complaints.  She would like to discuss treatment options   Review of Systems: Negative except as noted in the HPI. Denies N/V/F/Ch.  Past Medical History:  Diagnosis Date   ADHD (attention deficit hyperactivity disorder)    Bipolar 1 disorder ([facility])    Depression    Hyperthyroidism    Hypothyroid    Postpartum depression     Current Outpatient Medications:    amphetamine-dextroamphetamine (ADDERALL) 30 MG tablet, Take 1 tablet by mouth 2 (two) times daily., Disp: , Rfl:    lithium carbonate (ESKALITH) 450 MG CR tablet, TAKE 2 TABLETS BY MOUTH EVERY DAY AT BEDTIME, Disp: , Rfl:   Social History   Tobacco Use  Smoking Status Every Day   Packs/day: 0.50   Years: 3.00   Total pack years: 1.50   Types: Cigarettes  Smokeless Tobacco Never    No Known Allergies Objective:  There were no vitals filed for this visit. There is no height or weight on file to calculate BMI. Constitutional Well developed. Well nourished.  Vascular Dorsalis pedis pulses palpable bilaterally. Posterior tibial pulses palpable bilaterally. Capillary refill normal to all digits.  No cyanosis or clubbing noted. Pedal hair growth normal.  Neurologic Normal speech. Oriented to person, place, and time. Epicritic sensation to light touch grossly present bilaterally.   Dermatologic Nails well groomed and normal in appearance. No open wounds. No skin lesions.  Orthopedic: Plantarflexed left third and right fifth metatarsal noted.  Pain on palpation to the submetatarsal of each respective digit.  No flexor tendinitis noted.  No capsulitis noted.  Gait examination shows pes planovalgus foot structure with calcaneovalgus to many toe signs partially recruit the arch with dorsiflexion of the hallux.   Radiographs: 3 views of skeletally mature adult bilateral foot: There is decreasing Inclination Angle Increasing Talar Declination Angle Anterior Break in the Cyma Line Findings Consistent with Pes Planovalgus Deformity Incidental Finding of Bone Cyst Noted within the Fibula.  Plantarflexed left third and right fifth metatarsal noted Assessment:   1. Plantar flexed metatarsal bone of right foot   2. Plantar flexed metatarsal bone of left foot   3. Pes planovalgus    Plan:  Patient was evaluated and treated and all questions answered. Left third and right fifth met plantarflexed metatarsal -All questions and concerns were discussed with the patient in extensive detail given the amount of pain that she is experiencing she will benefit from I believe she will benefit from surgical intervention with floating osteotomy of the left thyroid and right face.  Given that she has failed conservative treatment options I believe she will benefit from surgical options at this time.  She states understand like to proceed with surgery.  She will be weightbearing as tolerated surgical shoe bilaterally.  Pes planovalgus -I explained to patient the etiology of pes planovalgus  and relationship with arch and heel pain and various treatment options were discussed.  Given patient foot structure in the setting of arch and heel pain I believe patient will benefit from custom-made orthotics to help control the hindfoot motion support the arch of the foot and take the stress away from arch and  heel pain patient agrees with the plan like to proceed with orthotics -Patient was casted for orthotics with offloading of left third and right fifth bilaterally   No follow-ups on file.

## 2021-11-02 ENCOUNTER — Telehealth: Payer: Self-pay | Admitting: Urology

## 2021-11-02 NOTE — Telephone Encounter (Signed)
DOS - 11/30/21  METATARSAL OSTEOTOMY 3RD LEFT, 5TH RIGHT --- [phone]  [employer] EFFECTIVE DATE - 10/15/16  SPOKE WITH [employer]'S AUTOMATIVE SYSTEM FOR CPT CODE 41287 NO PRIOR AUTH IS REQUIRED.  REF # [id number] REF # [id number]

## 2021-11-26 ENCOUNTER — Ambulatory Visit (INDEPENDENT_AMBULATORY_CARE_PROVIDER_SITE_OTHER): Payer: Managed Care, Other (non HMO) | Admitting: Family Medicine

## 2021-11-26 ENCOUNTER — Encounter: Payer: Self-pay | Admitting: Family Medicine

## 2021-11-26 VITALS — BP 109/73 | HR 96 | Ht 63.0 in | Wt 164.0 lb

## 2021-11-26 DIAGNOSIS — F319 Bipolar disorder, unspecified: Secondary | ICD-10-CM

## 2021-11-26 DIAGNOSIS — R7989 Other specified abnormal findings of blood chemistry: Secondary | ICD-10-CM | POA: Diagnosis not present

## 2021-11-26 NOTE — Assessment & Plan Note (Addendum)
Rechecking TSH, free T4 and T3 today.  Additionally will check TPO antibodies.  She does have a couple small nodules, likely end up ordering updated thyroid ultrasound as well.

## 2021-11-26 NOTE — Patient Instructions (Signed)
Nice to meet you today! We'll be in touch with lab results and recommendations.

## 2021-11-26 NOTE — Progress Notes (Signed)
Isabella Marks - 36 y.o. female MRN [id number]  Date of birth: 15-Feb-1986  Subjective No chief complaint on file.   HPI Isabella Marks is a 36 year old female here today for initial visit to establish care.  Seeing psychiatry for management of bipolar disorder and ADHD.  Stable with lithium and Adderall.  She has history of thyroid nodules and hyperthyroidism status post radioactive iodine ablation.  Reports she had thyroid checked previously and was told that she did not need any further medication.  Her psychiatrist recently checked labs on her and checked an updated TSH.  This was elevated at 95.  She has had some hair loss and mild fatigue but otherwise feels okay.  ROS:  A comprehensive ROS was completed and negative except as noted per HPI    No Known Allergies  Past Medical History:  Diagnosis Date   ADHD (attention deficit hyperactivity disorder)    Anxiety    Bipolar 1 disorder ([facility])    Depression    Hyperthyroidism    Hypothyroid    Postpartum depression     Past Surgical History:  Procedure Laterality Date   ADENOIDECTOMY     CESAREAN SECTION  05/03/2009   COSMETIC SURGERY  04/16/2008   RHINOPLASTY      Social History   Socioeconomic History   Marital status: Single    Spouse name: Not on file   Number of children: Not on file   Years of education: Not on file   Highest education level: Not on file  Occupational History   Not on file  Tobacco Use   Smoking status: Every Day    Packs/day: 0.25    Years: 5.00    Total pack years: 1.25    Types: Cigarettes   Smokeless tobacco: Never  Vaping Use   Vaping Use: Never used  Substance and Sexual Activity   Alcohol use: Not Currently    Alcohol/week: 1.0 standard drink of alcohol    Types: 1 Cans of beer per week    Comment: Occasional   Drug use: No   Sexual activity: Yes    Partners: Male    Birth control/protection: None  Other Topics Concern   Not on file  Social History Narrative   Not on file    Social Determinants of Health   Financial Resource Strain: Not on file  Food Insecurity: Not on file  Transportation Needs: Not on file  Physical Activity: Not on file  Stress: Not on file  Social Connections: Not on file    Family History  Problem Relation Age of Onset   Breast cancer Paternal Grandmother    Cancer Paternal Grandmother    Breast cancer Maternal Aunt    Lupus Maternal Aunt    Diabetes Paternal Aunt    Hypertension Father    Depression Mother    Drug abuse Mother    Early death Mother    Diabetes Paternal Uncle     Health Maintenance  Topic Date Due   COVID-19 Vaccine (1) Never done   HIV Screening  Never done   Hepatitis C Screening  Never done   TETANUS/TDAP  04/05/2017   PAP SMEAR-Modifier  08/25/2017   INFLUENZA VACCINE  12/15/2021   HPV VACCINES  Aged Out     ----------------------------------------------------------------------------------------------------------------------------------------------------------------------------------------------------------------- Physical Exam BP 109/73 (BP Location: Left Arm)   Pulse 96   Ht '5\' 3"'$  (1.6 m)   Wt 164 lb (74.4 kg)   SpO2 99%   BMI 29.05 kg/m   Physical  Exam Constitutional:      Appearance: Normal appearance.  HENT:     Head: Normocephalic and atraumatic.  Eyes:     General: No scleral icterus. Neck:     Comments: Small left-sided nodules Cardiovascular:     Rate and Rhythm: Normal rate and regular rhythm.  Neurological:     Mental Status: She is alert.  Psychiatric:        Mood and Affect: Mood normal.        Behavior: Behavior normal.     ------------------------------------------------------------------------------------------------------------------------------------------------------------------------------------------------------------------- Assessment and Plan  Elevated TSH Rechecking TSH, free T4 and T3 today.  Additionally will check TPO antibodies.  She does have a  couple small nodules, likely end up ordering updated thyroid ultrasound as well.  Bipolar 1 disorder ([place]) Management per psychiatry.  Stable with lithium at this time.   No orders of the defined types were placed in this encounter.   No follow-ups on file.    This visit occurred during the SARS-CoV-2 public health emergency.  Safety protocols were in place, including screening questions prior to the visit, additional usage of staff PPE, and extensive cleaning of exam room while observing appropriate contact time as indicated for disinfecting solutions.

## 2021-11-26 NOTE — Assessment & Plan Note (Signed)
Management per psychiatry.  Stable with lithium at this time.

## 2021-11-30 ENCOUNTER — Encounter: Payer: Self-pay | Admitting: Podiatry

## 2021-11-30 ENCOUNTER — Other Ambulatory Visit: Payer: Self-pay | Admitting: Podiatry

## 2021-11-30 DIAGNOSIS — M21542 Acquired clubfoot, left foot: Secondary | ICD-10-CM | POA: Diagnosis not present

## 2021-11-30 DIAGNOSIS — M21541 Acquired clubfoot, right foot: Secondary | ICD-10-CM | POA: Diagnosis not present

## 2021-11-30 LAB — CBC WITH DIFFERENTIAL/PLATELET
Absolute Monocytes: 410 cells/uL (ref 200–950)
Basophils Absolute: 41 cells/uL (ref 0–200)
Basophils Relative: 0.5 %
Eosinophils Absolute: 271 cells/uL (ref 15–500)
Eosinophils Relative: 3.3 %
HCT: 41.1 % (ref 35.0–45.0)
Hemoglobin: 13.6 g/dL (ref 11.7–15.5)
Lymphs Abs: 2608 cells/uL (ref 850–3900)
MCH: 32 pg (ref 27.0–33.0)
MCHC: 33.1 g/dL (ref 32.0–36.0)
MCV: 96.7 fL (ref 80.0–100.0)
MPV: 11.6 fL (ref 7.5–12.5)
Monocytes Relative: 5 %
Neutro Abs: 4871 cells/uL (ref 1500–7800)
Neutrophils Relative %: 59.4 %
Platelets: 211 10*3/uL (ref 140–400)
RBC: 4.25 10*6/uL (ref 3.80–5.10)
RDW: 11.8 % (ref 11.0–15.0)
Total Lymphocyte: 31.8 %
WBC: 8.2 10*3/uL (ref 3.8–10.8)

## 2021-11-30 LAB — COMPLETE METABOLIC PANEL WITH GFR
AG Ratio: 1.6 (calc) (ref 1.0–2.5)
ALT: 17 U/L (ref 6–29)
AST: 15 U/L (ref 10–30)
Albumin: 4.2 g/dL (ref 3.6–5.1)
Alkaline phosphatase (APISO): 65 U/L (ref 31–125)
BUN: 9 mg/dL (ref 7–25)
CO2: 25 mmol/L (ref 20–32)
Calcium: 9.1 mg/dL (ref 8.6–10.2)
Chloride: 108 mmol/L (ref 98–110)
Creat: 0.79 mg/dL (ref 0.50–0.97)
Globulin: 2.7 g/dL (calc) (ref 1.9–3.7)
Glucose, Bld: 87 mg/dL (ref 65–99)
Potassium: 3.9 mmol/L (ref 3.5–5.3)
Sodium: 138 mmol/L (ref 135–146)
Total Bilirubin: 0.3 mg/dL (ref 0.2–1.2)
Total Protein: 6.9 g/dL (ref 6.1–8.1)
eGFR: 101 mL/min/{1.73_m2} (ref >=60)

## 2021-11-30 LAB — THYROID PEROXIDASE ANTIBODY: Thyroperoxidase Ab SerPl-aCnc: 480 IU/mL — ABNORMAL HIGH (ref <9)

## 2021-11-30 LAB — T3: T3, Total: 132 ng/dL (ref 76–181)

## 2021-11-30 LAB — T4, FREE: Free T4: 0.9 ng/dL (ref 0.8–1.8)

## 2021-11-30 LAB — TSH: TSH: 14.85 mIU/L — ABNORMAL HIGH

## 2021-11-30 MED ORDER — OXYCODONE-ACETAMINOPHEN 5-325 MG PO TABS: 1.0000 | ORAL_TABLET | ORAL | 0 refills | Status: DC | PRN

## 2021-11-30 MED ORDER — IBUPROFEN 800 MG PO TABS: 800.0000 mg | ORAL_TABLET | Freq: Four times a day (QID) | ORAL | 1 refills | Status: AC | PRN

## 2021-12-02 ENCOUNTER — Other Ambulatory Visit: Payer: Self-pay | Admitting: Family Medicine

## 2021-12-02 DIAGNOSIS — E079 Disorder of thyroid, unspecified: Secondary | ICD-10-CM

## 2021-12-02 DIAGNOSIS — E041 Nontoxic single thyroid nodule: Secondary | ICD-10-CM

## 2021-12-02 MED ORDER — UNITHROID 50 MCG PO TABS: 50.0000 ug | ORAL_TABLET | Freq: Every day | ORAL | 3 refills | Status: DC

## 2021-12-04 ENCOUNTER — Ambulatory Visit (INDEPENDENT_AMBULATORY_CARE_PROVIDER_SITE_OTHER): Payer: Managed Care, Other (non HMO)

## 2021-12-04 DIAGNOSIS — E041 Nontoxic single thyroid nodule: Secondary | ICD-10-CM | POA: Diagnosis not present

## 2021-12-09 ENCOUNTER — Ambulatory Visit (INDEPENDENT_AMBULATORY_CARE_PROVIDER_SITE_OTHER): Payer: Managed Care, Other (non HMO)

## 2021-12-09 ENCOUNTER — Encounter: Payer: Self-pay | Admitting: Podiatry

## 2021-12-09 ENCOUNTER — Ambulatory Visit (INDEPENDENT_AMBULATORY_CARE_PROVIDER_SITE_OTHER): Payer: Managed Care, Other (non HMO) | Admitting: Podiatry

## 2021-12-09 DIAGNOSIS — Z9889 Other specified postprocedural states: Secondary | ICD-10-CM

## 2021-12-09 DIAGNOSIS — M216X2 Other acquired deformities of left foot: Secondary | ICD-10-CM

## 2021-12-09 DIAGNOSIS — M216X1 Other acquired deformities of right foot: Secondary | ICD-10-CM

## 2021-12-09 NOTE — Progress Notes (Signed)
  Subjective:  Patient ID: Isabella Marks, female    DOB: 25-Jul-1985,  MRN: [id number]  Chief Complaint  Patient presents with   Routine Post Op    POV #1 DOS 11/30/2021 LT 3RD & 5TH RT METATARSAL OSTEOTOMY FLOATING    DOS: 11/30/2021 Procedure: Left third right fifth metatarsal osteotomy floating  36 y.o. female returns for post-op check.  Patient states that she is doing well.  Bandages clean dry and intact minimal complaints.  Pain managed  Review of Systems: Negative except as noted in the HPI. Denies N/V/F/Ch.  Past Medical History:  Diagnosis Date   ADHD (attention deficit hyperactivity disorder)    Anxiety    Bipolar 1 disorder ([facility])    Depression    Hyperthyroidism    Hypothyroid    Postpartum depression     Current Outpatient Medications:    AMBIEN 10 MG tablet, Take 10 mg by mouth at bedtime., Disp: , Rfl:    amphetamine-dextroamphetamine (ADDERALL) 30 MG tablet, Take 1 tablet by mouth 2 (two) times daily., Disp: , Rfl:    ibuprofen (ADVIL) 800 MG tablet, Take 1 tablet (800 mg total) by mouth every 6 (six) hours as needed., Disp: 60 tablet, Rfl: 1   lithium carbonate (ESKALITH) 450 MG CR tablet, TAKE 2 TABLETS BY MOUTH EVERY DAY AT BEDTIME, Disp: , Rfl:    oxyCODONE-acetaminophen (PERCOCET) 5-325 MG tablet, Take 1 tablet by mouth every 4 (four) hours as needed for severe pain., Disp: 30 tablet, Rfl: 0   UNITHROID 50 MCG tablet, Take 1 tablet (50 mcg total) by mouth daily before breakfast., Disp: 30 tablet, Rfl: 3  Social History   Tobacco Use  Smoking Status Every Day   Packs/day: 0.25   Years: 5.00   Total pack years: 1.25   Types: Cigarettes  Smokeless Tobacco Never    No Known Allergies Objective:  There were no vitals filed for this visit. There is no height or weight on file to calculate BMI. Constitutional Well developed. Well nourished.  Vascular Foot warm and well perfused. Capillary refill normal to all digits.   Neurologic Normal speech. Oriented  to person, place, and time. Epicritic sensation to light touch grossly present bilaterally.  Dermatologic Skin healing well without signs of infection. Skin edges well coapted without signs of infection.  Orthopedic: Tenderness to palpation noted about the surgical site.   Radiographs: 3 views of skeletally mature adult bilateral foot: Good correction alignment noted reduction of plantar pressure noted. Assessment:   1. Plantar flexed metatarsal bone of right foot   2. Status post foot surgery   3. Plantar flexed metatarsal bone of left foot    Plan:  Patient was evaluated and treated and all questions answered.  S/p foot surgery bilaterally -Progressing as expected post-operatively. -XR: See above -WB Status: Weightbearing as tolerated in surgical shoe -Sutures: Intact.  No clinical signs of Deis is noted.  No complication noted. -Medications: None -Foot redressed.  No follow-ups on file.

## 2021-12-23 ENCOUNTER — Ambulatory Visit (INDEPENDENT_AMBULATORY_CARE_PROVIDER_SITE_OTHER): Payer: Managed Care, Other (non HMO) | Admitting: Podiatry

## 2021-12-23 DIAGNOSIS — M216X1 Other acquired deformities of right foot: Secondary | ICD-10-CM

## 2021-12-23 DIAGNOSIS — Z9889 Other specified postprocedural states: Secondary | ICD-10-CM

## 2021-12-23 NOTE — Progress Notes (Signed)
  Subjective:  Patient ID: Isabella Marks, female    DOB: 10/29/1985,  MRN: [id number]  Chief Complaint  Patient presents with   Routine Post Op    POV #2 DOS 11/30/2021 LT 3RD & 5TH RT METATARSAL OSTEOTOMY FLOATING    DOS: 11/30/2021 Procedure: Left third right fifth metatarsal osteotomy floating  36 y.o. female returns for post-op check.  Patient states that she is doing well.  Bandages clean dry and intact minimal complaints.  Pain managed  Review of Systems: Negative except as noted in the HPI. Denies N/V/F/Ch.  Past Medical History:  Diagnosis Date   ADHD (attention deficit hyperactivity disorder)    Anxiety    Bipolar 1 disorder ([facility])    Depression    Hyperthyroidism    Hypothyroid    Postpartum depression     Current Outpatient Medications:    AMBIEN 10 MG tablet, Take 10 mg by mouth at bedtime., Disp: , Rfl:    amphetamine-dextroamphetamine (ADDERALL) 30 MG tablet, Take 1 tablet by mouth 2 (two) times daily., Disp: , Rfl:    ibuprofen (ADVIL) 800 MG tablet, Take 1 tablet (800 mg total) by mouth every 6 (six) hours as needed., Disp: 60 tablet, Rfl: 1   lithium carbonate (ESKALITH) 450 MG CR tablet, TAKE 2 TABLETS BY MOUTH EVERY DAY AT BEDTIME, Disp: , Rfl:    oxyCODONE-acetaminophen (PERCOCET) 5-325 MG tablet, Take 1 tablet by mouth every 4 (four) hours as needed for severe pain., Disp: 30 tablet, Rfl: 0   UNITHROID 50 MCG tablet, Take 1 tablet (50 mcg total) by mouth daily before breakfast., Disp: 30 tablet, Rfl: 3  Social History   Tobacco Use  Smoking Status Every Day   Packs/day: 0.25   Years: 5.00   Total pack years: 1.25   Types: Cigarettes  Smokeless Tobacco Never    No Known Allergies Objective:  There were no vitals filed for this visit. There is no height or weight on file to calculate BMI. Constitutional Well developed. Well nourished.  Vascular Foot warm and well perfused. Capillary refill normal to all digits.   Neurologic Normal speech. Oriented  to person, place, and time. Epicritic sensation to light touch grossly present bilaterally.  Dermatologic Skin healing well without signs of infection. Skin edges well coapted without signs of infection.  Reduction of plantar pressure noted  Orthopedic: No Tenderness to palpation noted about the surgical site.   Radiographs: 3 views of skeletally mature adult bilateral foot: Good correction alignment noted reduction of plantar pressure noted. Assessment:   1. Plantar flexed metatarsal bone of right foot   2. Status post foot surgery     Plan:  Patient was evaluated and treated and all questions answered.  S/p foot surgery bilaterally -Progressing as expected post-operatively. -XR: See above -WB Status: Weightbearing as tolerated in s regular shoes -Sutures: Removed no clinical signs of Deis is noted.  No complication noted. -Medications: None -Orthotics were dispensed.  They are functioning well.  No follow-ups on file.

## 2022-01-29 ENCOUNTER — Encounter: Payer: Managed Care, Other (non HMO) | Admitting: Podiatry

## 2022-02-02 ENCOUNTER — Other Ambulatory Visit: Payer: Managed Care, Other (non HMO)

## 2022-02-03 LAB — TSH: TSH: 1.95 mIU/L

## 2022-03-24 ENCOUNTER — Telehealth: Payer: Self-pay | Admitting: *Deleted

## 2022-03-24 ENCOUNTER — Ambulatory Visit: Payer: Managed Care, Other (non HMO) | Admitting: Podiatry

## 2022-03-24 DIAGNOSIS — M216X1 Other acquired deformities of right foot: Secondary | ICD-10-CM

## 2022-03-24 DIAGNOSIS — M216X2 Other acquired deformities of left foot: Secondary | ICD-10-CM | POA: Diagnosis not present

## 2022-03-24 NOTE — Telephone Encounter (Signed)
Patient is calling because she will be 10 minutes late for appointment, heavy traffic, accident,should arrive '@2'$ :55. I explained per provider that if later than that will have to rescheduled. She verbalized understanding.

## 2022-03-26 NOTE — Progress Notes (Signed)
  Subjective:  Patient ID: Isabella Marks, female    DOB: [birth year],  MRN: [id number]  Chief Complaint  Patient presents with   Routine Post Op    DOS: 11/30/2021 Procedure: Left third right fifth metatarsal osteotomy floating  36 y.o. female returns for post-op check.  Patient states she is doing well no pain she has returned to regular shoes has healed completely from the floating osteotomy.  Review of Systems: Negative except as noted in the HPI. Denies N/V/F/Ch.  Past Medical History:  Diagnosis Date   ADHD (attention deficit hyperactivity disorder)    Anxiety    Bipolar 1 disorder (HCC)    Depression    Hyperthyroidism    Hypothyroid    Postpartum depression     Current Outpatient Medications:    AMBIEN 10 MG tablet, Take 10 mg by mouth at bedtime., Disp: , Rfl:    amphetamine-dextroamphetamine (ADDERALL) 30 MG tablet, Take 1 tablet by mouth 2 (two) times daily., Disp: , Rfl:    ibuprofen (ADVIL) 800 MG tablet, Take 1 tablet (800 mg total) by mouth every 6 (six) hours as needed., Disp: 60 tablet, Rfl: 1   lithium carbonate (ESKALITH) 450 MG CR tablet, TAKE 2 TABLETS BY MOUTH EVERY DAY AT BEDTIME, Disp: , Rfl:    oxyCODONE-acetaminophen (PERCOCET) 5-325 MG tablet, Take 1 tablet by mouth every 4 (four) hours as needed for severe pain., Disp: 30 tablet, Rfl: 0   UNITHROID 50 MCG tablet, Take 1 tablet (50 mcg total) by mouth daily before breakfast., Disp: 30 tablet, Rfl: 3  Social History   Tobacco Use  Smoking Status Every Day   Packs/day: 0.25   Years: 5.00   Total pack years: 1.25   Types: Cigarettes  Smokeless Tobacco Never    No Known Allergies Objective:  There were no vitals filed for this visit. There is no height or weight on file to calculate BMI. Constitutional Well developed. Well nourished.  Vascular Foot warm and well perfused. Capillary refill normal to all digits.   Neurologic Normal speech. Oriented to person, place, and time. Epicritic sensation  to light touch grossly present bilaterally.  Dermatologic Skin healing well without signs of infection. Skin edges well coapted without signs of infection.  Reduction of plantar pressure noted  Orthopedic: No Tenderness to palpation noted about the surgical site.   Radiographs: 3 views of skeletally mature adult bilateral foot: Good correction alignment noted reduction of plantar pressure noted. Assessment:   No diagnosis found.   Plan:  Patient was evaluated and treated and all questions answered.  S/p foot surgery bilaterally -Clinically healed and is doing well.  At this time if any foot and ankle issues are in the future advised her to come back and see me.  She states understanding.  No follow-ups on file.

## 2022-04-03 ENCOUNTER — Other Ambulatory Visit: Payer: Self-pay | Admitting: Family Medicine

## 2022-05-06 ENCOUNTER — Ambulatory Visit (INDEPENDENT_AMBULATORY_CARE_PROVIDER_SITE_OTHER): Payer: Managed Care, Other (non HMO) | Admitting: Podiatry

## 2022-05-06 DIAGNOSIS — Q666 Other congenital valgus deformities of feet: Secondary | ICD-10-CM

## 2022-05-06 DIAGNOSIS — M216X1 Other acquired deformities of right foot: Secondary | ICD-10-CM

## 2022-05-06 DIAGNOSIS — M216X2 Other acquired deformities of left foot: Secondary | ICD-10-CM

## 2022-07-01 ENCOUNTER — Telehealth: Payer: Self-pay | Admitting: Family Medicine

## 2022-07-01 NOTE — Telephone Encounter (Signed)
Patient made aware appointment is confirmed. Isabella Marks

## 2022-07-01 NOTE — Telephone Encounter (Signed)
Patient is requesting to transfer care from Dr. Zigmund Daniel to Dr. Huel Cote, patient states he is uncomfortable with current provider and would like to switch. Isabella Marks

## 2022-07-01 NOTE — Telephone Encounter (Signed)
Patient scheduled via MyChart, called to confirm initiation of TOC request, stated would confirm or cancel appointment based on provider response. Isabella Marks

## 2022-07-06 ENCOUNTER — Encounter: Payer: Self-pay | Admitting: Family Medicine

## 2022-07-06 ENCOUNTER — Ambulatory Visit: Payer: Managed Care, Other (non HMO) | Admitting: Family Medicine

## 2022-07-06 VITALS — BP 122/79 | HR 93 | Temp 97.7°F | Ht 63.0 in | Wt 166.8 lb

## 2022-07-06 DIAGNOSIS — D1801 Hemangioma of skin and subcutaneous tissue: Secondary | ICD-10-CM

## 2022-07-06 DIAGNOSIS — E039 Hypothyroidism, unspecified: Secondary | ICD-10-CM | POA: Diagnosis not present

## 2022-07-06 DIAGNOSIS — F4321 Adjustment disorder with depressed mood: Secondary | ICD-10-CM | POA: Insufficient documentation

## 2022-07-06 DIAGNOSIS — J45909 Unspecified asthma, uncomplicated: Secondary | ICD-10-CM | POA: Insufficient documentation

## 2022-07-06 DIAGNOSIS — G4700 Insomnia, unspecified: Secondary | ICD-10-CM | POA: Insufficient documentation

## 2022-07-06 DIAGNOSIS — R87619 Unspecified abnormal cytological findings in specimens from cervix uteri: Secondary | ICD-10-CM | POA: Insufficient documentation

## 2022-07-06 DIAGNOSIS — F321 Major depressive disorder, single episode, moderate: Secondary | ICD-10-CM | POA: Insufficient documentation

## 2022-07-06 DIAGNOSIS — L239 Allergic contact dermatitis, unspecified cause: Secondary | ICD-10-CM | POA: Diagnosis not present

## 2022-07-06 DIAGNOSIS — Z5181 Encounter for therapeutic drug level monitoring: Secondary | ICD-10-CM

## 2022-07-06 DIAGNOSIS — F319 Bipolar disorder, unspecified: Secondary | ICD-10-CM | POA: Diagnosis not present

## 2022-07-06 DIAGNOSIS — Z7689 Persons encountering health services in other specified circumstances: Secondary | ICD-10-CM

## 2022-07-06 DIAGNOSIS — F341 Dysthymic disorder: Secondary | ICD-10-CM | POA: Insufficient documentation

## 2022-07-06 DIAGNOSIS — R000 Tachycardia, unspecified: Secondary | ICD-10-CM

## 2022-07-06 HISTORY — DX: Unspecified asthma, uncomplicated: J45.909

## 2022-07-06 MED ORDER — TRIAMCINOLONE ACETONIDE 0.1 % EX CREA: 1.0000 | TOPICAL_CREAM | Freq: Two times a day (BID) | CUTANEOUS | 0 refills | Status: DC

## 2022-07-06 NOTE — Progress Notes (Addendum)
New Patient Office Visit  Subjective    Patient ID: Isabella Marks, female    DOB: 11/10/1985  Age: 37 y.o. MRN: [id number]  CC:  Chief Complaint  Patient presents with   Establish Care   Urticaria    Started almost 5-6 weeks ago. Complains of itching and flaking. She states that they are spreading.    Nevus    Pt complains of a "strawberry" spot on her forehead. She denies pain, but complains of itching. She states that it has grown in size.    HPI Isabella Marks presents to establish care  Hives Pt reports she has had hives for the last 5 weeks. She has some on her neck, lips, chin. She hasn't tried anything except for charcoal mask. She just tried this in the last few days but otherwise nothing else. They do itch. She denies any new contact exposures. She also reports fatigue for the last 3 weeks. She has had headache, cough, and feels like mentally she is irritated.  Pt asks about a red spot on her forehead. Feels like it's getting bigger.   Bipolar/ADHD She has hx of ADHD and on Adderall. She is taking Ambien at night. She is using lithium also for bipolar disorder. She reports her psychiatrist did labs back in Sept 2023. She reports her heart rate is fast sometimes. She does drink a red bull daily.  Hypothyroidism She has hx of Radioactive Iodine for her thyroid in 2011. She was on synthroid for a short while but was taken off up until last year when it was checked.     Outpatient Encounter Medications as of 07/06/2022  Medication Sig   AMBIEN 10 MG tablet Take 10 mg by mouth at bedtime.   amphetamine-dextroamphetamine (ADDERALL) 30 MG tablet Take 1 tablet by mouth 2 (two) times daily.   ibuprofen (ADVIL) 800 MG tablet Take 1 tablet (800 mg total) by mouth every 6 (six) hours as needed.   levothyroxine (SYNTHROID) 50 MCG tablet TAKE 1 TABLET BY MOUTH DAILY BEFORE BREAKFAST   lithium carbonate (ESKALITH) 450 MG CR tablet TAKE 2 TABLETS BY MOUTH EVERY DAY AT BEDTIME    triamcinolone cream (KENALOG) 0.1 % Apply 1 Application topically 2 (two) times daily.   [DISCONTINUED] oxyCODONE-acetaminophen (PERCOCET) 5-325 MG tablet Take 1 tablet by mouth every 4 (four) hours as needed for severe pain. (Patient not taking: Reported on 07/06/2022)   No facility-administered encounter medications on file as of 07/06/2022.    Past Medical History:  Diagnosis Date   ADHD (attention deficit hyperactivity disorder)    Anxiety    Asthma 07/06/2022   Bipolar 1 disorder (HCC)    Depression    Hyperthyroidism    Hypothyroid    Postpartum depression     Past Surgical History:  Procedure Laterality Date   ADENOIDECTOMY     CESAREAN SECTION  05/03/2009   COSMETIC SURGERY  04/16/2008   RHINOPLASTY      Family History  Problem Relation Age of Onset   Breast cancer Paternal Grandmother    Cancer Paternal Grandmother    Breast cancer Maternal Aunt    Lupus Maternal Aunt    Diabetes Paternal Aunt    Hypertension Father    Depression Mother    Drug abuse Mother    Early death Mother    Diabetes Paternal Uncle     Social History   Socioeconomic History   Marital status: Significant Other    Spouse name: Not on file  Number of children: Not on file   Years of education: Not on file   Highest education level: Not on file  Occupational History   Not on file  Tobacco Use   Smoking status: Every Day    Packs/day: 0.25    Years: 5.00    Total pack years: 1.25    Types: Cigarettes   Smokeless tobacco: Never  Vaping Use   Vaping Use: Never used  Substance and Sexual Activity   Alcohol use: Not Currently    Alcohol/week: 1.0 standard drink of alcohol    Types: 1 Cans of beer per week    Comment: Occasional   Drug use: No   Sexual activity: Yes    Partners: Male    Birth control/protection: None  Other Topics Concern   Not on file  Social History Narrative   Not on file   Social Determinants of Health   Financial Resource Strain: Not on file  Food  Insecurity: Not on file  Transportation Needs: Not on file  Physical Activity: Not on file  Stress: Not on file  Social Connections: Not on file  Intimate Partner Violence: Not on file    Review of Systems  Constitutional:  Positive for malaise/fatigue.  Cardiovascular:  Positive for palpitations.  Skin:  Positive for rash.       Red spot on forehead  All other systems reviewed and are negative.       Objective    BP 122/79   Pulse 93   Temp 97.7 F (36.5 C) (Oral)   Ht 5' 3"$  (1.6 m)   Wt 166 lb 12.8 oz (75.7 kg)   SpO2 100%   BMI 29.55 kg/m   Physical Exam Vitals and nursing note reviewed.  Constitutional:      Appearance: Normal appearance. She is normal weight.  HENT:     Head: Normocephalic and atraumatic.     Comments: Cherry angioma on right forehead near scalp Dry skin around chin and neck    Right Ear: External ear normal.     Left Ear: External ear normal.     Nose: Nose normal.     Mouth/Throat:     Mouth: Mucous membranes are moist.     Pharynx: Oropharynx is clear.  Eyes:     Conjunctiva/sclera: Conjunctivae normal.     Pupils: Pupils are equal, round, and reactive to light.  Cardiovascular:     Rate and Rhythm: Normal rate and regular rhythm.     Pulses: Normal pulses.     Heart sounds: Normal heart sounds.  Pulmonary:     Effort: Pulmonary effort is normal.     Breath sounds: Normal breath sounds.  Abdominal:     General: Abdomen is flat. Bowel sounds are normal.  Skin:    General: Skin is warm and dry.     Capillary Refill: Capillary refill takes less than 2 seconds.  Neurological:     General: No focal deficit present.     Mental Status: She is alert and oriented to person, place, and time. Mental status is at baseline.  Psychiatric:        Mood and Affect: Mood normal.        Behavior: Behavior normal.        Thought Content: Thought content normal.        Judgment: Judgment normal.       Assessment & Plan:   Problem List  Items Addressed This Visit  Other   Bipolar 1 disorder ([place])   Relevant Orders   Lithium level   Other Visit Diagnoses     Encounter to establish care with new doctor    -  Primary   Allergic contact dermatitis, unspecified trigger       Relevant Medications   triamcinolone cream (KENALOG) 0.1 %   Hypothyroidism, unspecified type       Relevant Orders   TSH   T4, free   Cherry angioma       Medication monitoring encounter       Relevant Orders   Lithium level   Heart rate fast         Suspect contact dermatitis. Dry skin only on her neck and around chin. Could be cosmetics? Advised to avoid this for a week, use triamcinolone cream prn.  Has hx of hypothyroidism. Check thyroid levels today Continue follow up with psychiatrist and will check lithium levels today Advised pt that her fast heart rate is likely from Adderall and Red Bull daily. To cut back on the Red bull and avoid other caffeinated drinks.  Return in about 3 months (around 10/04/2022) for Annual Physical.   Leeanne Rio, MD

## 2022-07-07 LAB — TSH: TSH: 1.05 u[IU]/mL (ref 0.450–4.500)

## 2022-07-07 LAB — LITHIUM LEVEL: Lithium Lvl: 1.2 mmol/L (ref 0.5–1.2)

## 2022-07-07 LAB — T4, FREE: Free T4: 1.19 ng/dL (ref 0.82–1.77)

## 2022-08-31 ENCOUNTER — Other Ambulatory Visit: Payer: Self-pay | Admitting: Family Medicine

## 2022-08-31 DIAGNOSIS — L239 Allergic contact dermatitis, unspecified cause: Secondary | ICD-10-CM

## 2022-10-02 ENCOUNTER — Other Ambulatory Visit: Payer: Self-pay | Admitting: Family Medicine

## 2022-10-04 ENCOUNTER — Encounter: Payer: Self-pay | Admitting: Family Medicine

## 2022-10-04 ENCOUNTER — Ambulatory Visit (INDEPENDENT_AMBULATORY_CARE_PROVIDER_SITE_OTHER): Payer: BC Managed Care – PPO | Admitting: Family Medicine

## 2022-10-04 VITALS — BP 105/64 | HR 85 | Temp 98.3°F | Resp 18 | Ht 63.0 in | Wt 162.7 lb

## 2022-10-04 DIAGNOSIS — Z3009 Encounter for other general counseling and advice on contraception: Secondary | ICD-10-CM

## 2022-10-04 DIAGNOSIS — R7302 Impaired glucose tolerance (oral): Secondary | ICD-10-CM

## 2022-10-04 DIAGNOSIS — E039 Hypothyroidism, unspecified: Secondary | ICD-10-CM

## 2022-10-04 DIAGNOSIS — Z13220 Encounter for screening for lipoid disorders: Secondary | ICD-10-CM

## 2022-10-04 DIAGNOSIS — Z0000 Encounter for general adult medical examination without abnormal findings: Secondary | ICD-10-CM

## 2022-10-04 DIAGNOSIS — Z136 Encounter for screening for cardiovascular disorders: Secondary | ICD-10-CM | POA: Diagnosis not present

## 2022-10-04 MED ORDER — NORGESTIM-ETH ESTRAD TRIPHASIC 0.18/0.215/0.25 MG-25 MCG PO TABS
1.0000 | ORAL_TABLET | Freq: Every day | ORAL | 3 refills | Status: AC
Start: 2022-10-04 — End: ?

## 2022-10-04 NOTE — Progress Notes (Signed)
Complete physical exam  Patient: Isabella Marks   DOB: [birth year]   37 y.o. Female  MRN: [id number]  Subjective:    Chief Complaint  Patient presents with   Annual Exam    Patient is here for annual physical,  she is fasting and would like to speak about starting on Birth control    Ashleyn Agne is a 37 y.o. female who presents today for a complete physical exam. She reports consuming a general diet.  Walking and lifting  She generally feels well. She reports sleeping well. She does have additional problems to discuss today.  Pt would like to start birth control. Last time being on OCPs was 5 years.  She is a smoker but trying to cut back. She smokes about 2 cigs a day.  Most recent fall risk assessment:    07/06/2022   10:35 AM  Fall Risk   Falls in the past year? 0  Number falls in past yr: 0  Injury with Fall? 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   Major depressive disorder, single episode, moderate (HCC) 07/06/2022   Persistent insomnia 07/06/2022   Bipolar 1 disorder (HCC) 07/09/2015   Attention deficit hyperactivity disorder (ADHD) 07/09/2015   Tobacco dependence 07/09/2015   Past Medical History:  Diagnosis Date   ADHD (attention deficit hyperactivity disorder)    Anxiety    Asthma 07/06/2022   Bipolar 1 disorder (HCC)    Depression    Hyperthyroidism    Hypothyroid    Postpartum depression    Past Surgical History:  Procedure Laterality Date   ADENOIDECTOMY     CESAREAN SECTION  05/03/2009   COSMETIC SURGERY  04/16/2008   RHINOPLASTY     Social  History   Tobacco Use   Smoking status: Every Day    Packs/day: 0.25    Years: 5.00    Additional pack years: 0.00    Total pack years: 1.25    Types: Cigarettes   Smokeless tobacco: Never  Vaping Use   Vaping Use: Never used  Substance Use Topics   Alcohol use: Not Currently    Alcohol/week: 1.0 standard drink of alcohol    Types: 1 Cans of beer per week    Comment: Occasional   Drug use: No   Family Status  Relation Name Status   PGM Emmarillius Matos (Not Specified)   Mat Aunt  (Not Specified)   Mat Aunt  (Not Specified)   Emelda Brothers  (Not Specified)   Father Carlye Grippe (Not Specified)   Mother Talbert Cage (Not Specified)   Pat Uncle Crist Fat (Not Specified)   Family History  Problem Relation Age of Onset   Breast cancer Paternal Grandmother    Cancer Paternal Grandmother    Breast cancer Maternal Aunt    Lupus Maternal Aunt    Diabetes Paternal Aunt    Hypertension Father    Depression Mother    Drug abuse Mother    Early death  Mother    Diabetes Paternal Uncle    No Known Allergies    Patient Care Team: Suzan Slick, MD as PCP - General (Family Medicine)   Outpatient Medications Prior to Visit  Medication Sig   AMBIEN 10 MG tablet Take 10 mg by mouth at bedtime.   amphetamine-dextroamphetamine (ADDERALL) 30 MG tablet Take 1 tablet by mouth 2 (two) times daily.   ibuprofen (ADVIL) 800 MG tablet Take 1 tablet (800 mg total) by mouth every 6 (six) hours as needed.   levothyroxine (SYNTHROID) 50 MCG tablet TAKE 1 TABLET BY MOUTH DAILY BEFORE BREAKFAST   lithium carbonate (ESKALITH) 450 MG CR tablet TAKE 2 TABLETS BY MOUTH EVERY DAY AT BEDTIME   triamcinolone cream (KENALOG) 0.1 % APPLY TO AFFECTED AREA TWICE A DAY   No facility-administered medications prior to visit.    Review of Systems  All other systems reviewed and are negative.         Objective:     BP 105/64   Pulse 85   Temp 98.3 F (36.8 C) (Oral)   Resp 18   Ht 5\' 3"   (1.6 m)   Wt 162 lb 11.2 oz (73.8 kg)   LMP 09/04/2022   SpO2 100%   BMI 28.82 kg/m  BP Readings from Last 3 Encounters:  10/04/22 105/64  07/06/22 122/79  11/26/21 109/73      Physical Exam Vitals and nursing note reviewed.  Constitutional:      Appearance: Normal appearance. She is normal weight.  HENT:     Head: Normocephalic and atraumatic.     Right Ear: Tympanic membrane, ear canal and external ear normal.     Left Ear: Tympanic membrane, ear canal and external ear normal.     Nose: Nose normal.     Mouth/Throat:     Mouth: Mucous membranes are moist.     Pharynx: Oropharynx is clear.  Eyes:     Conjunctiva/sclera: Conjunctivae normal.     Pupils: Pupils are equal, round, and reactive to light.  Cardiovascular:     Rate and Rhythm: Normal rate and regular rhythm.     Pulses: Normal pulses.     Heart sounds: Normal heart sounds.  Pulmonary:     Effort: Pulmonary effort is normal.     Breath sounds: Normal breath sounds.  Abdominal:     General: Abdomen is flat. Bowel sounds are normal.  Skin:    General: Skin is warm.     Capillary Refill: Capillary refill takes less than 2 seconds.  Neurological:     General: No focal deficit present.     Mental Status: She is alert and oriented to person, place, and time. Mental status is at baseline.  Psychiatric:        Mood and Affect: Mood normal.        Behavior: Behavior normal.        Thought Content: Thought content normal.        Judgment: Judgment normal.     No results found for any visits on 10/04/22.      Assessment & Plan:    Routine Health Maintenance and Physical Exam  Immunization History  Administered Date(s) Administered   Hep A / Hep B 04/06/2007, 05/05/2007, 10/13/2007   IPV 05/05/2007   Influenza Nasal 05/05/2007, 03/18/2009, 03/16/2010   Influenza,inj,Quad PF,6+ Mos 07/04/2008, 01/30/2015, 03/06/2019   Influenza-Unspecified 02/13/2008   MMR 05/05/2007   Meningococcal Conjugate 04/06/2007    PPD Test 10/13/2007  Tdap 04/06/2007   Typhoid Inactivated 10/13/2007   Yellow Fever 05/05/2007    Health Maintenance  Topic Date Due   COVID-19 Vaccine (1) Never done   DTaP/Tdap/Td (2 - Td or Tdap) 04/05/2017   INFLUENZA VACCINE  12/16/2022   PAP SMEAR-Modifier  11/29/2023   Hepatitis C Screening  Completed   HIV Screening  Completed   HPV VACCINES  Aged Out    Discussed health benefits of physical activity, and encouraged her to engage in regular exercise appropriate for her age and condition.  Problem List Items Addressed This Visit   None  No follow-ups on file. Annual physical exam  Impaired glucose tolerance -     CBC with Differential/Platelet -     Comprehensive metabolic panel -     Hemoglobin A1c  Encounter for lipid screening for cardiovascular disease -     Lipid panel  Family planning advice -     Norgestim-Eth Estrad Triphasic; Take 1 tablet by mouth daily.  Dispense: 84 tablet; Refill: 3  Hypothyroidism, unspecified type -     TSH -     T4, free   Screening labs including thyroid Start OCP Sunday start date. Counseled pt on the use of OCP and smoking, which may increase risk of blood clots. She will continue to cut back.      Suzan Slick, MD

## 2022-10-05 ENCOUNTER — Other Ambulatory Visit: Payer: Self-pay | Admitting: Family Medicine

## 2022-10-05 DIAGNOSIS — E039 Hypothyroidism, unspecified: Secondary | ICD-10-CM

## 2022-10-05 LAB — COMPREHENSIVE METABOLIC PANEL
ALT: 16 IU/L (ref 0–32)
AST: 13 IU/L (ref 0–40)
Albumin/Globulin Ratio: 1.6 (ref 1.2–2.2)
Albumin: 3.9 g/dL (ref 3.9–4.9)
Alkaline Phosphatase: 93 IU/L (ref 44–121)
BUN/Creatinine Ratio: 13 (ref 9–23)
BUN: 9 mg/dL (ref 6–20)
Bilirubin Total: <0.2 mg/dL (ref 0.0–1.2)
CO2: 21 mmol/L (ref 20–29)
Calcium: 8.6 mg/dL — ABNORMAL LOW (ref 8.7–10.2)
Chloride: 103 mmol/L (ref 96–106)
Creatinine, Ser: 0.67 mg/dL (ref 0.57–1.00)
Globulin, Total: 2.5 g/dL (ref 1.5–4.5)
Glucose: 92 mg/dL (ref 70–99)
Potassium: 4.3 mmol/L (ref 3.5–5.2)
Sodium: 139 mmol/L (ref 134–144)
Total Protein: 6.4 g/dL (ref 6.0–8.5)
eGFR: 118 mL/min/{1.73_m2} (ref >59)

## 2022-10-05 LAB — CBC WITH DIFFERENTIAL/PLATELET
Basophils Absolute: 0 10*3/uL (ref 0.0–0.2)
Basos: 1 %
EOS (ABSOLUTE): 0.4 10*3/uL (ref 0.0–0.4)
Eos: 5 %
Hematocrit: 42.7 % (ref 34.0–46.6)
Hemoglobin: 14.6 g/dL (ref 11.1–15.9)
Immature Grans (Abs): 0 10*3/uL (ref 0.0–0.1)
Immature Granulocytes: 0 %
Lymphocytes Absolute: 2.7 10*3/uL (ref 0.7–3.1)
Lymphs: 40 %
MCH: 30.7 pg (ref 26.6–33.0)
MCHC: 34.2 g/dL (ref 31.5–35.7)
MCV: 90 fL (ref 79–97)
Monocytes Absolute: 0.6 10*3/uL (ref 0.1–0.9)
Monocytes: 9 %
Neutrophils Absolute: 3 10*3/uL (ref 1.4–7.0)
Neutrophils: 45 %
Platelets: 175 10*3/uL (ref 150–450)
RBC: 4.75 x10E6/uL (ref 3.77–5.28)
RDW: 12.1 % (ref 11.7–15.4)
WBC: 6.6 10*3/uL (ref 3.4–10.8)

## 2022-10-05 LAB — TSH: TSH: 0.061 u[IU]/mL — ABNORMAL LOW (ref 0.450–4.500)

## 2022-10-05 LAB — HEMOGLOBIN A1C
Est. average glucose Bld gHb Est-mCnc: 123 mg/dL
Hgb A1c MFr Bld: 5.9 % — ABNORMAL HIGH (ref 4.8–5.6)

## 2022-10-05 LAB — LIPID PANEL
Chol/HDL Ratio: 3.6 ratio (ref 0.0–4.4)
Cholesterol, Total: 177 mg/dL (ref 100–199)
HDL: 49 mg/dL (ref >39)
LDL Chol Calc (NIH): 86 mg/dL (ref 0–99)
Triglycerides: 252 mg/dL — ABNORMAL HIGH (ref 0–149)
VLDL Cholesterol Cal: 42 mg/dL — ABNORMAL HIGH (ref 5–40)

## 2022-10-05 LAB — T4, FREE: Free T4: 1.45 ng/dL (ref 0.82–1.77)

## 2022-10-05 MED ORDER — LEVOTHYROXINE SODIUM 25 MCG PO TABS
25.0000 ug | ORAL_TABLET | Freq: Every day | ORAL | 0 refills | Status: DC
Start: 2022-10-05 — End: 2023-01-03

## 2022-11-11 DIAGNOSIS — F323 Major depressive disorder, single episode, severe with psychotic features: Secondary | ICD-10-CM | POA: Diagnosis not present

## 2023-01-02 ENCOUNTER — Other Ambulatory Visit: Payer: Self-pay | Admitting: Family Medicine

## 2023-01-02 DIAGNOSIS — E039 Hypothyroidism, unspecified: Secondary | ICD-10-CM

## 2023-02-09 IMAGING — US US OB < 14 WEEKS - US OB TV
1 series · 15 of 28 positions shown · non-contrast
Comparison: None.

CLINICAL DATA: Cramping, bleeding

EXAM:
OBSTETRIC <14 WK US AND TRANSVAGINAL OB US
TECHNIQUE: Both transabdominal and transvaginal ultrasound examinations were
performed for complete evaluation of the gestation as well as the
maternal uterus, adnexal regions, and pelvic cul-de-sac.
Transvaginal technique was performed to assess early pregnancy.

[Series 1: us ob < 14 weeks - us ob tv · 15 of 67 slices shown]
[im 1/67]
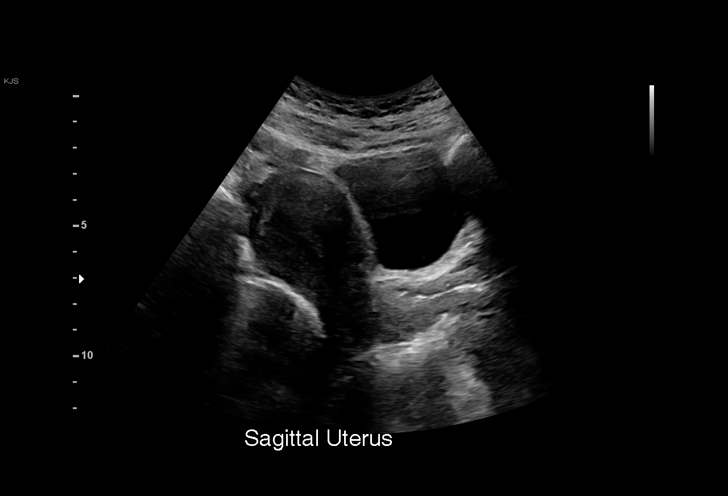
[im 5/67]
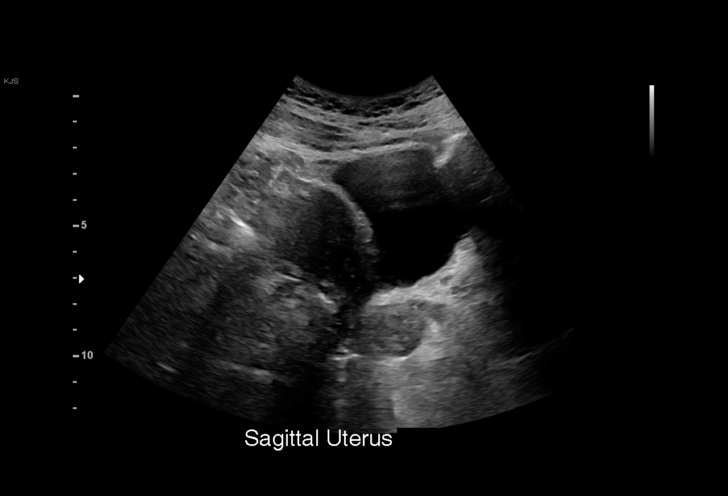
[im 10/67]
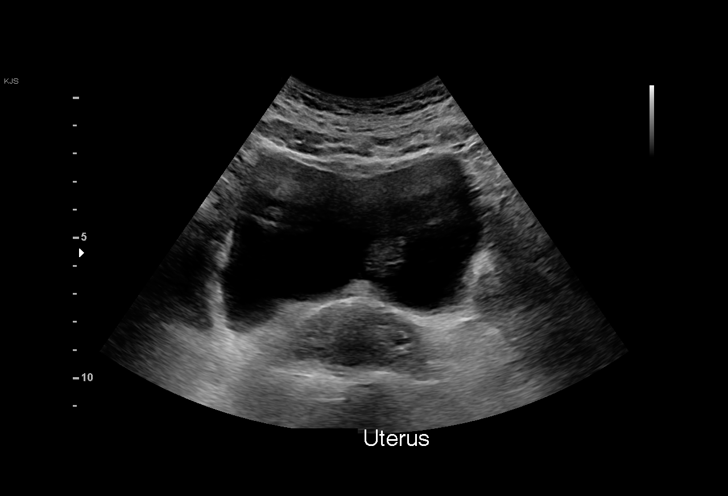
[im 15/67]
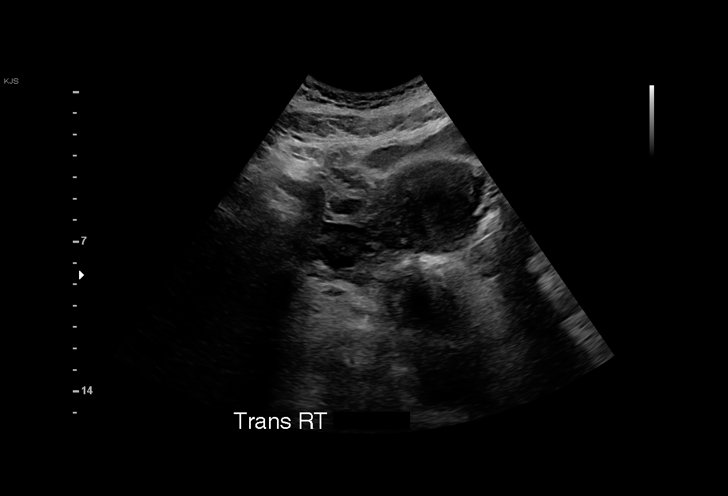
[im 20/67]
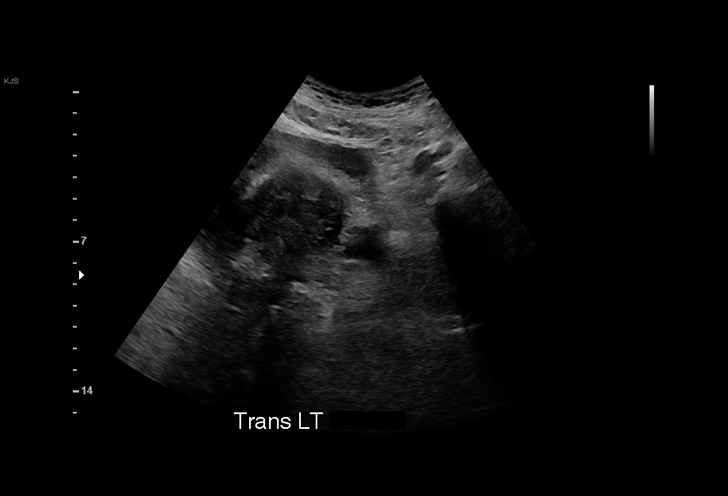
[im 25/67]
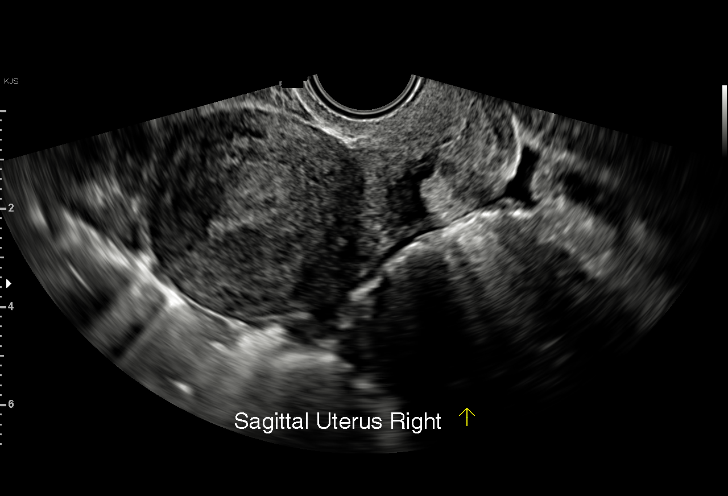
[im 30/67]
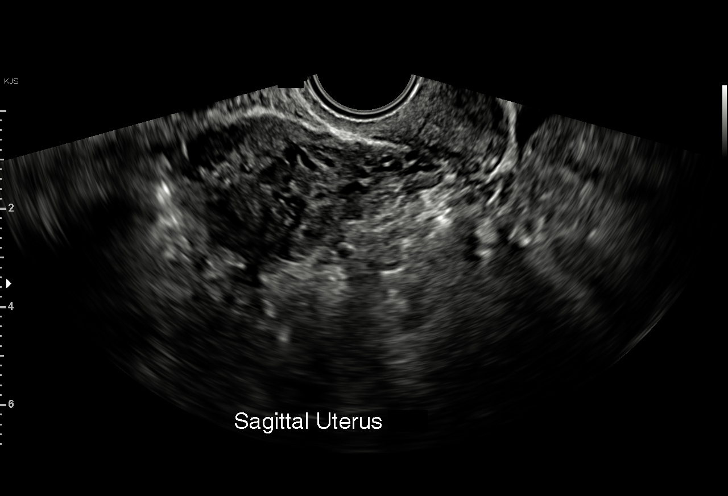
[im 35/67]
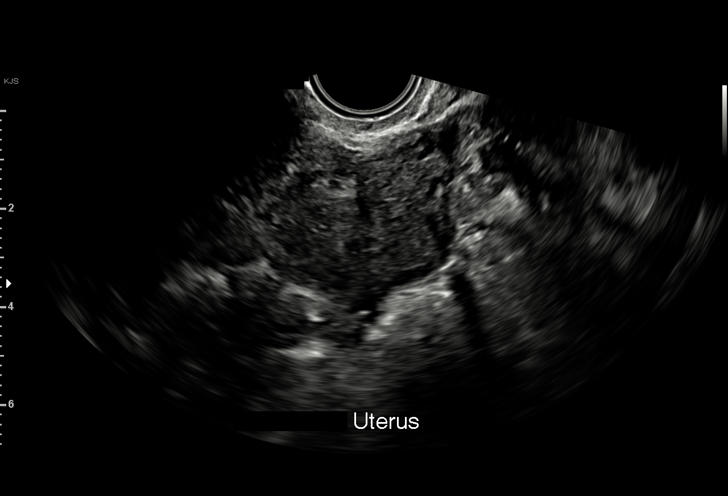
[im 37/67]
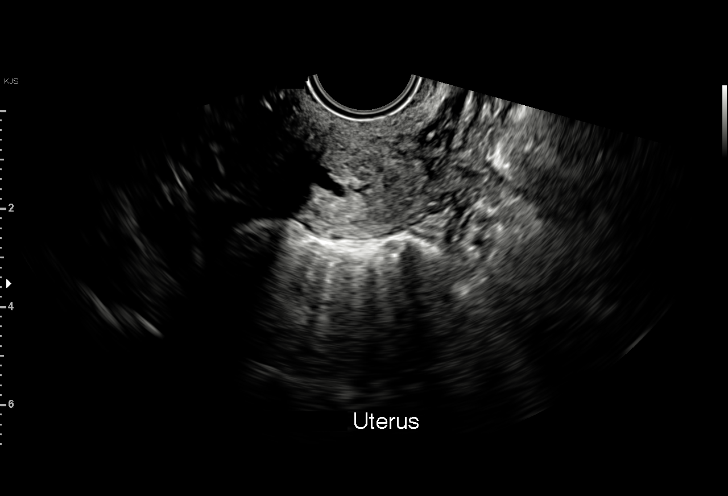
[im 42/67]
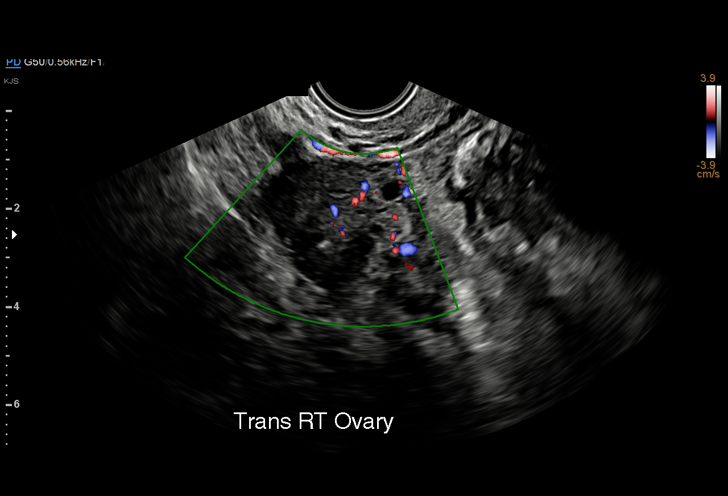
[im 47/67]
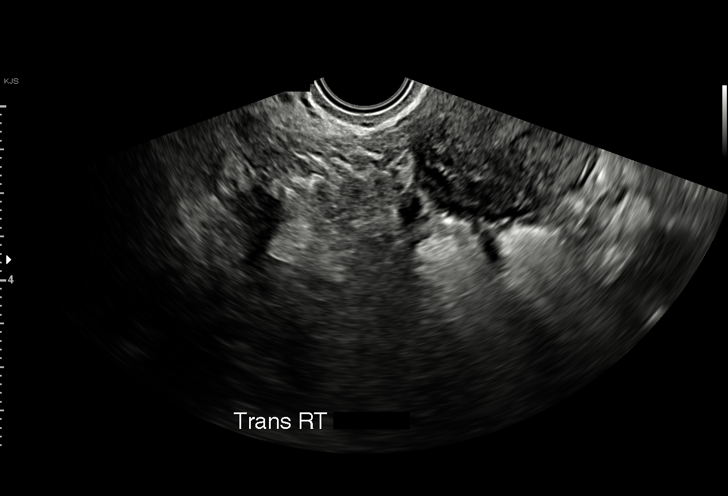
[im 52/67]
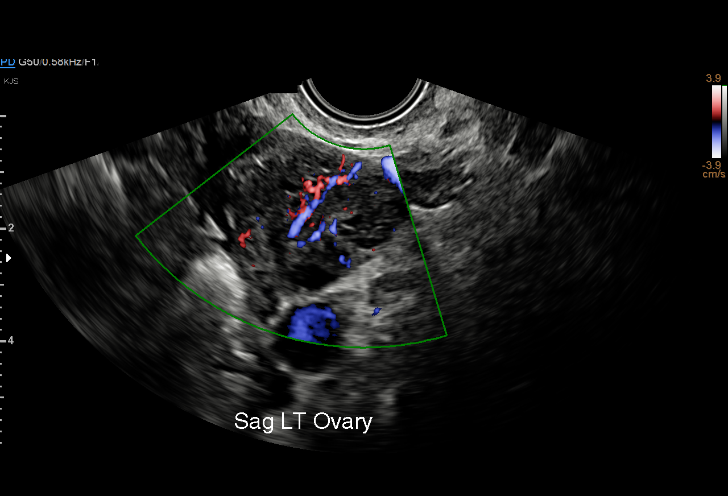
[im 57/67]
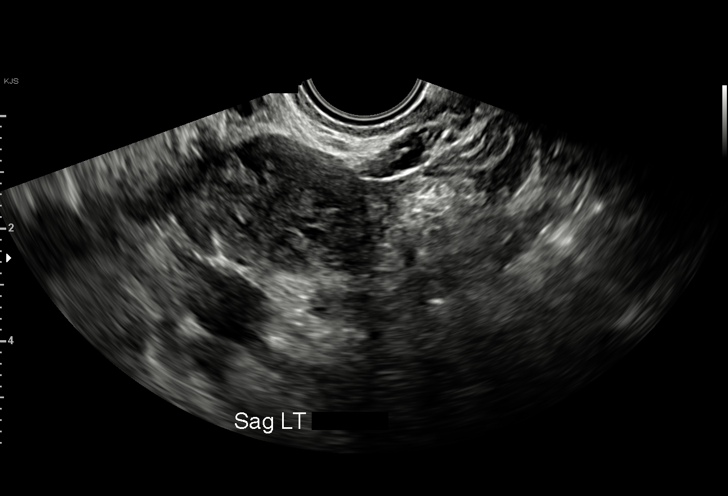
[im 62/67]
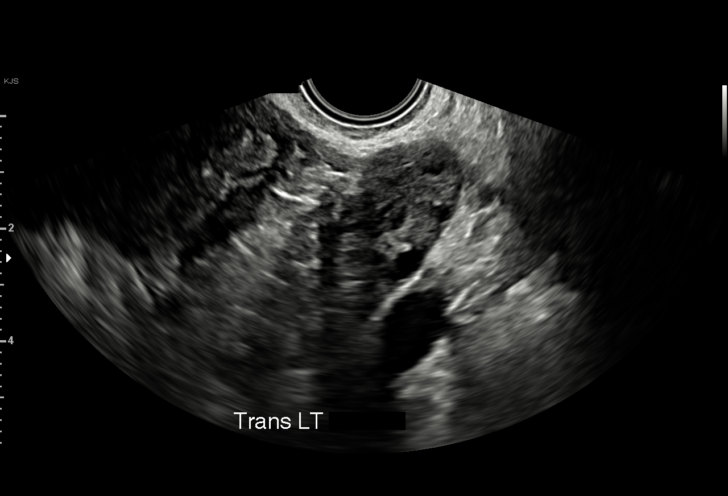
[im 67/67]
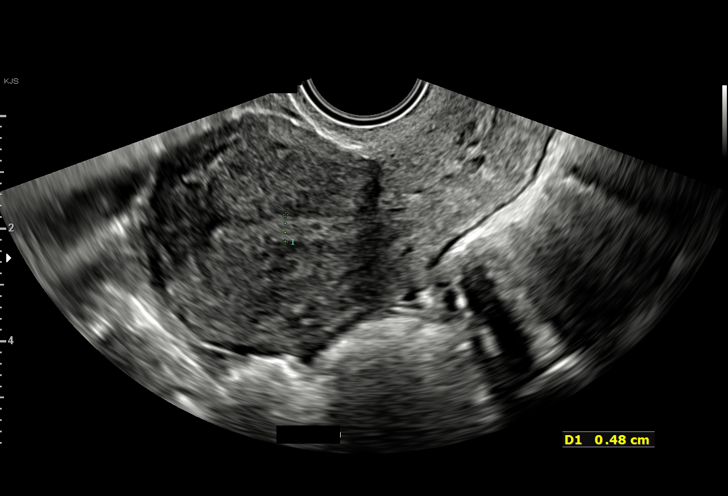

[15 of 28 positions shown; findings below may reference images not displayed]

FINDINGS: Intrauterine gestational sac: None

Yolk sac:  Not Visualized.

Embryo:  Not Visualized.

Cardiac Activity: Not Visualized.

Heart Rate: Not applicable

Endometrial thickness 5 mm.

Subchorionic hemorrhage:  None visualized.

Maternal uterus/adnexae: Normal sonographic appearance of the right
and left ovaries. There is no evidence of adnexal mass. There is
trace free fluid in the pelvis which appears as simple fluid, likely
physiologic.
IMPRESSION: Pregnancy of unknown location. Recommend trending beta hCG and close
clinical follow-up with OB-GYN.

## 2023-02-12 NOTE — Progress Notes (Signed)
Seen by casting department

## 2023-03-31 ENCOUNTER — Ambulatory Visit: Payer: 59 | Admitting: Family Medicine

## 2023-03-31 ENCOUNTER — Encounter: Payer: Self-pay | Admitting: Family Medicine

## 2023-03-31 VITALS — BP 111/72 | HR 87 | Temp 97.9°F | Resp 18 | Ht 63.0 in | Wt 155.3 lb

## 2023-03-31 DIAGNOSIS — Z23 Encounter for immunization: Secondary | ICD-10-CM

## 2023-03-31 DIAGNOSIS — E039 Hypothyroidism, unspecified: Secondary | ICD-10-CM | POA: Diagnosis not present

## 2023-03-31 DIAGNOSIS — R7302 Impaired glucose tolerance (oral): Secondary | ICD-10-CM | POA: Diagnosis not present

## 2023-03-31 NOTE — Progress Notes (Signed)
Established Patient Office Visit  Subjective   Patient ID: Isabella Marks, female    DOB: 11-Oct-1985  Age: 37 y.o. MRN: [id number]  Chief Complaint  Patient presents with   Medical Management of Chronic Issues    Patient is here to discuss Hashimoto's and pre - diabetes and would like to get a Flu Vaccine and T-Dap    HPI  Hypothyroidism Pt taking synthroid daily. She is compliant with medication and takes daily. She was seen for labs in May and had labs done. Noted to have low TSH so her thyroid dose was dropped from to . She was advised to follow back up in 6 weeks. She never came due to lag in her insurance.   Prediabetes She would like a check up on her A1c. Last A1c showed she has prediabetes. She reports she has dry mouth and she has a lot of sweatiness under her armpits.  Pt asks about mammograms. She is worried because she has lumps she's felt underneath her armpits.   Pt would like flu and Tdap vaccines today.    Review of Systems  Endo/Heme/Allergies:        Lumps underneath arm pit Dry mouth, sweating  All other systems reviewed and are negative.    Objective:     There were no vitals taken for this visit. BP Readings from Last 3 Encounters:  03/31/23 111/72  10/04/22 105/64  07/06/22 122/79      Physical Exam Vitals and nursing note reviewed.  Constitutional:      Appearance: Normal appearance. She is normal weight.  HENT:     Head: Normocephalic and atraumatic.     Right Ear: External ear normal.     Left Ear: External ear normal.     Nose: Nose normal.     Mouth/Throat:     Mouth: Mucous membranes are moist.     Pharynx: Oropharynx is clear.  Eyes:     Conjunctiva/sclera: Conjunctivae normal.     Pupils: Pupils are equal, round, and reactive to light.  Cardiovascular:     Rate and Rhythm: Normal rate.  Pulmonary:     Effort: Pulmonary effort is normal.  Skin:    General: Skin is warm.     Capillary Refill: Capillary refill  takes less than 2 seconds.     Comments: Subcutaneous lumps underneath axilla bilaterally likely lymph nodes  Neurological:     General: No focal deficit present.     Mental Status: She is alert and oriented to person, place, and time. Mental status is at baseline.  Psychiatric:        Mood and Affect: Mood normal.        Behavior: Behavior normal.        Thought Content: Thought content normal.        Judgment: Judgment normal.    No results found for any visits on 03/31/23.  Last hemoglobin A1c Lab Results  Component Value Date   HGBA1C 5.9 (H) 10/04/2022   Last thyroid functions Lab Results  Component Value Date   TSH 0.061 (L) 10/04/2022   T3TOTAL 132 11/26/2021      The ASCVD Risk score (Arnett DK, et al., 2019) failed to calculate for the following reasons:   The 2019 ASCVD risk score is only valid for ages 33 to 35    Assessment & Plan:   Problem List Items Addressed This Visit   None Hypothyroidism, unspecified type -     TSH -  T4, free  Impaired glucose tolerance -     Hemoglobin A1c  Need for influenza vaccination -     Flu vaccine trivalent PF, 6mos and older(Flulaval,Afluria,Fluarix,Fluzone)  Need for Tdap vaccination -     Tdap vaccine greater than or equal to 7yo IM   To recheck TSH/T4. Continue Synthroid daily for now pending results. To monitor and recheck A1c due to prediabetes. Continue to work on diet and exercise. Flu and Tdap vaccines today Reassured pt that her lumps under both axilla are likely lymph nodes. To monitor for now.  No follow-ups on file.    Suzan Slick, MD

## 2023-04-01 LAB — T4, FREE: Free T4: 1.3 ng/dL (ref 0.82–1.77)

## 2023-04-01 LAB — HEMOGLOBIN A1C
Est. average glucose Bld gHb Est-mCnc: 117 mg/dL
Hgb A1c MFr Bld: 5.7 % — ABNORMAL HIGH (ref 4.8–5.6)

## 2023-04-01 LAB — TSH: TSH: 1.91 u[IU]/mL (ref 0.450–4.500)

## 2023-04-06 ENCOUNTER — Other Ambulatory Visit: Payer: Self-pay | Admitting: Family Medicine

## 2023-04-06 DIAGNOSIS — E039 Hypothyroidism, unspecified: Secondary | ICD-10-CM

## 2023-06-30 ENCOUNTER — Ambulatory Visit: Payer: 59 | Admitting: Family Medicine

## 2023-06-30 ENCOUNTER — Ambulatory Visit: Payer: Self-pay | Admitting: Family Medicine

## 2023-06-30 ENCOUNTER — Encounter: Payer: Self-pay | Admitting: Family Medicine

## 2023-06-30 VITALS — BP 117/77 | HR 77 | Resp 18 | Ht 63.0 in | Wt 155.0 lb

## 2023-06-30 DIAGNOSIS — Z3201 Encounter for pregnancy test, result positive: Secondary | ICD-10-CM

## 2023-06-30 DIAGNOSIS — R051 Acute cough: Secondary | ICD-10-CM

## 2023-06-30 LAB — POCT INFLUENZA A/B
Influenza A, POC: NEGATIVE
Influenza B, POC: NEGATIVE

## 2023-06-30 LAB — POC COVID19 BINAXNOW: SARS Coronavirus 2 Ag: NEGATIVE

## 2023-06-30 MED ORDER — AMOXICILLIN-POT CLAVULANATE 875-125 MG PO TABS: 1.0000 | ORAL_TABLET | Freq: Two times a day (BID) | ORAL | 0 refills | Status: AC

## 2023-06-30 NOTE — Progress Notes (Signed)
Acute Office Visit  Subjective:     Patient ID: Isabella Marks, female    DOB: [birth year], 38 y.o.   MRN: [id number]  Chief Complaint  Patient presents with   Acute Visit    Started Tuesday with dry nose, nose bleeds, cough, N/V today, chills, body aches    HPI Patient is in today for acute visit.  She reports her symptoms started on Tuesday with nasal congestion, dry nose, nose bleeds on the right side only with head pressure worse on the right side. The cough has been present for a week. She has tried nothing for her symptoms. She has had chills and sweats for the last 3 days. She tried to go back to work today. She hasn't been to work since Monday. She stayed out of work on Tuesday and Wednesday, tried to go back today for an hour and then left.   She reports her period was suppose to come 13 days ago. She reports her periods use to skip but hasn't in the last year. She took urine pregnancy test x 2 at home today; one was negative and 1 was positive. She reports she has been nauseated today and this is what made her take the test.   Review of Systems  HENT:  Positive for congestion, nosebleeds and sinus pain.   Respiratory:  Positive for cough.   Gastrointestinal:  Positive for nausea and vomiting.  All other systems reviewed and are negative.       Objective:    BP 117/77   Pulse 77   Resp 18   Ht 5\' 3"  (1.6 m)   Wt 155 lb (70.3 kg)   SpO2 99%   BMI 27.46 kg/m  BP Readings from Last 3 Encounters:  06/30/23 117/77  03/31/23 111/72  10/04/22 105/64      Physical Exam Vitals and nursing note reviewed.  Constitutional:      Appearance: Normal appearance. She is normal weight.  HENT:     Head: Normocephalic and atraumatic.     Right Ear: Tympanic membrane, ear canal and external ear normal.     Left Ear: Tympanic membrane, ear canal and external ear normal.     Nose: Nose normal.     Mouth/Throat:     Mouth: Mucous membranes are moist.     Pharynx: Oropharynx is  clear.  Eyes:     Conjunctiva/sclera: Conjunctivae normal.     Pupils: Pupils are equal, round, and reactive to light.  Cardiovascular:     Rate and Rhythm: Normal rate and regular rhythm.     Pulses: Normal pulses.     Heart sounds: Normal heart sounds.  Pulmonary:     Effort: Pulmonary effort is normal.     Breath sounds: Normal breath sounds.  Skin:    General: Skin is warm.     Capillary Refill: Capillary refill takes less than 2 seconds.  Neurological:     General: No focal deficit present.     Mental Status: She is alert and oriented to person, place, and time. Mental status is at baseline.  Psychiatric:        Mood and Affect: Mood normal.        Behavior: Behavior normal.        Thought Content: Thought content normal.        Judgment: Judgment normal.   No results found for any visits on 06/30/23.      Assessment & Plan:   Problem List Items Addressed  This Visit   None Visit Diagnoses       Acute cough    -  Primary   Relevant Orders   POC COVID-19 BinaxNow   POCT Influenza A/B      Acute cough -     POC COVID-19 BinaxNow -     POCT Influenza A/B -     Amoxicillin-Pot Clavulanate; Take 1 tablet by mouth 2 (two) times daily.  Dispense: 20 tablet; Refill: 0  Positive urine pregnancy test -     Beta hCG quant (ref lab)   Pt symptoms likely from sinusitis. Flu and covid negativ.  Treat with Augmentin 875mg  BID x 10 days.  Due to positive urine pregnancy test, will do confirmation blood HCG today. No orders of the defined types were placed in this encounter.   Return in about 3 months (around 10/05/2023) for Annual Physical.  Suzan Slick, MD

## 2023-06-30 NOTE — Telephone Encounter (Signed)
Chief Complaint: headache Symptoms: nausea, vomiting x 1 episode, chills, headache, light sensitivity Frequency: headache constant x 3 days, nausea and vomiting x today Pertinent Negatives: Patient denies sore throat, runny nose, recent head injury or falls, changes in vision or speech. Disposition: [] ED /[] Urgent Care (no appt availability in office) / [x] Appointment(In office/virtual)/ []  [facility]/ [] Home Care/ [] Refused Recommended Disposition /[] [facility]/ []  Follow-up with PCP Additional Notes: Patient c/o headache since Tuesday with dry nose, 2 nose bleeds, cough. She states today she was experiencing nausea and vomiting. Patient has missed her period this month and states she took a home pregnancy test this morning but it was negative. Patient has not taken Tylenol or Ibuprofen for headache and advised OTC treatment with rest prior to appointment. Patient offered appointment with PCP tomorrow but states she would prefer to be seen today. Patient scheduled with [facility]NP [facility] for this afternoon.   Copied from CRM [id number]. Topic: Clinical - Red Word Triage >> Jun 30, 2023 11:31 AM Prudencio Pair wrote: Red Word that prompted transfer to Nurse Triage: Patient states she has been feeling sick for the last two days with migraines in the face & nausea today. States it's hard to lift her head due to migraines. Thinks it's due to her thyroid. Wants to see if she can see pcp today. Reason for Disposition  [1] MODERATE headache (e.g., interferes with normal activities) AND [2] present > 24 hours AND [3] unexplained  (Exceptions: analgesics not tried, typical migraine, or headache part of viral illness)  Answer Assessment - Initial Assessment Questions 1. LOCATION: "Where does it hurt?"      Right side of face, from temple to bottom of chin.  2. ONSET: "When did the headache start?" (Minutes, hours or days)      Started on Tuesday.  3. PATTERN: "Does the pain come and go, or has  it been constant since it started?"     Constant.  4. SEVERITY: "How bad is the pain?" and "What does it keep you from doing?"  (e.g., Scale 1-10; mild, moderate, or severe)   - MILD (1-3): doesn't interfere with normal activities    - MODERATE (4-7): interferes with normal activities or awakens from sleep    - SEVERE (8-10): excruciating pain, unable to do any normal activities        5/10.  5. RECURRENT SYMPTOM: "Have you ever had headaches before?" If Yes, ask: "When was the last time?" and "What happened that time?"      Denies having frequent headaches but states she has had them before. She states the last one was about a month ago. She states in the past she could treat the headaches at home.  6. CAUSE: "What do you think is causing the headache?"     She states she feels like the weather change brought on the headache.  7. MIGRAINE: "Have you been diagnosed with migraine headaches?" If Yes, ask: "Is this headache similar?"      Denies.  8. HEAD INJURY: "Has there been any recent injury to the head?"      Denies.  9. OTHER SYMPTOMS: "Do you have any other symptoms?" (fever, stiff neck, eye pain, sore throat, cold symptoms)     Nausea and vomiting, cough, nose feels dry, nosebleed happened twice on the right side on Tuesday, fever on Tuesday, feels like hot and cold sweats x 1 week.  10. PREGNANCY: "Is there any chance you are pregnant?" "When was your  last menstrual period?"       LMP 05/19/23, states she took a pregnancy test this morning and it was negative.  Protocols used: [facility]

## 2023-07-01 ENCOUNTER — Encounter: Payer: Self-pay | Admitting: Family Medicine

## 2023-07-01 LAB — BETA HCG QUANT (REF LAB): hCG Quant: <1 m[IU]/mL

## 2023-07-23 ENCOUNTER — Other Ambulatory Visit: Payer: Self-pay | Admitting: Family Medicine

## 2023-07-23 DIAGNOSIS — E039 Hypothyroidism, unspecified: Secondary | ICD-10-CM

## 2023-09-27 ENCOUNTER — Encounter: Payer: 59 | Admitting: Family Medicine

## 2023-10-05 ENCOUNTER — Encounter: Payer: Self-pay | Admitting: Family Medicine

## 2023-10-06 ENCOUNTER — Ambulatory Visit (INDEPENDENT_AMBULATORY_CARE_PROVIDER_SITE_OTHER): Admitting: Family Medicine

## 2023-10-06 ENCOUNTER — Encounter: Payer: Self-pay | Admitting: Family Medicine

## 2023-10-06 VITALS — BP 119/76 | HR 87 | Temp 98.3°F | Resp 18 | Ht 63.0 in | Wt 151.9 lb

## 2023-10-06 DIAGNOSIS — Z0000 Encounter for general adult medical examination without abnormal findings: Secondary | ICD-10-CM | POA: Diagnosis not present

## 2023-10-06 DIAGNOSIS — Z136 Encounter for screening for cardiovascular disorders: Secondary | ICD-10-CM

## 2023-10-06 DIAGNOSIS — E039 Hypothyroidism, unspecified: Secondary | ICD-10-CM

## 2023-10-06 DIAGNOSIS — R7302 Impaired glucose tolerance (oral): Secondary | ICD-10-CM | POA: Diagnosis not present

## 2023-10-06 DIAGNOSIS — Z13220 Encounter for screening for lipoid disorders: Secondary | ICD-10-CM

## 2023-10-06 DIAGNOSIS — Z124 Encounter for screening for malignant neoplasm of cervix: Secondary | ICD-10-CM | POA: Diagnosis not present

## 2023-10-06 NOTE — Progress Notes (Signed)
 Complete physical exam  Patient: Isabella Marks   DOB: 01-05-1986   37 y.o. Female  MRN: [id number]  Subjective:     Chief Complaint  Patient presents with   Annual Exam    Patient is here for annual physical and pap smear    Isabella Marks is a 38 y.o. female who presents today for a complete physical exam. She reports consuming a general diet. walking She generally feels fairly well. She reports sleeping fairly well. She does not have additional problems to discuss today.    Most recent fall risk assessment:    07/06/2022   10:35 AM  Fall Risk   Falls in the past year? 0  Number falls in past yr: 0  Injury with Fall? 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   Major depressive disorder, single episode, moderate (HCC) 07/06/2022   Persistent insomnia 07/06/2022   Bipolar 1 disorder (HCC) 07/09/2015   Attention deficit hyperactivity disorder (ADHD) 07/09/2015   Tobacco dependence 07/09/2015   Past Medical History:  Diagnosis Date   ADHD (attention deficit hyperactivity disorder)    Anxiety    Asthma 07/06/2022   Bipolar 1 disorder (HCC)    Depression    Hyperthyroidism    Hypothyroid    Postpartum depression    Past Surgical History:  Procedure Laterality Date   ADENOIDECTOMY     CESAREAN SECTION  05/03/2009   COSMETIC SURGERY  04/16/2008   RHINOPLASTY     Social History   Socioeconomic History   Marital status: Significant Other    Spouse name: Not on file   Number of children: Not on file   Years of education: Not on file   Highest education  level: Not on file  Occupational History   Not on file  Tobacco Use   Smoking status: Every Day    Current packs/day: 0.25    Average packs/day: 0.3 packs/day for 5.0 years (1.3 ttl pk-yrs)    Types: Cigarettes   Smokeless tobacco: Never  Vaping Use   Vaping status: Never Used  Substance and Sexual Activity   Alcohol use: Not Currently    Alcohol/week: 1.0 standard drink of alcohol    Types: 1 Cans of beer per week    Comment: Occasional   Drug use: No   Sexual activity: Yes    Partners: Male    Birth control/protection: None  Other Topics Concern   Not on file  Social History Narrative   Not on file   Social Drivers of Health   Financial Resource Strain: Not on file  Food Insecurity: Not on file  Transportation Needs: Not on file  Physical Activity: Not on file  Stress: Not on file  Social Connections: Unknown (09/28/2021)   Received from [facility], [facility]   Social Network    Social Network: Not on file  Intimate Partner Violence: Unknown (08/19/2021)   [facility]   HITS  Physically Hurt: Not on file    Insult or Talk Down To: Not on file    Threaten Physical Harm: Not on file    Scream or Curse: Not on file   Family History  Problem Relation Age of Onset   Breast cancer Paternal Grandmother    Cancer Paternal Grandmother    Breast cancer Maternal Aunt    Lupus Maternal Aunt    Diabetes Paternal Aunt    Hypertension Father    Depression Mother    Drug abuse Mother    Early death Mother    Diabetes Paternal Uncle    No Known Allergies  Patient Care Team: Manette Section, MD as PCP - General (Family Medicine)   Outpatient Medications Prior to Visit  Medication Sig   AMBIEN 10 MG tablet Take 10 mg by mouth at bedtime.   amoxicillin -clavulanate (AUGMENTIN ) 875-125 MG tablet Take 1 tablet by mouth 2 (two) times daily.   amphetamine-dextroamphetamine (ADDERALL) 30 MG tablet Take 1 tablet by mouth 2 (two) times  daily.   ibuprofen  (ADVIL ) 800 MG tablet Take 1 tablet (800 mg total) by mouth every 6 (six) hours as needed.   levothyroxine  (SYNTHROID ) 25 MCG tablet TAKE 1 TABLET BY MOUTH DAILY BEFORE BREAKFAST.   lithium  carbonate (ESKALITH) 450 MG CR tablet TAKE 2 TABLETS BY MOUTH EVERY DAY AT BEDTIME   Norgestimate-Ethinyl Estradiol Triphasic (TRI-LO-SPRINTEC) 0.18/0.215/0.25 MG-25 MCG tab Take 1 tablet by mouth daily.   triamcinolone  cream (KENALOG ) 0.1 % APPLY TO AFFECTED AREA TWICE A DAY   No facility-administered medications prior to visit.    Review of Systems  All other systems reviewed and are negative.         Objective:     BP 119/76   Pulse 87   Temp 98.3 F (36.8 C) (Oral)   Resp 18   Ht 5\' 3"  (1.6 m)   Wt 151 lb 14.4 oz (68.9 kg)   LMP 09/03/2023 (Exact Date)   SpO2 100%   BMI 26.91 kg/m  BP Readings from Last 3 Encounters:  10/06/23 119/76  06/30/23 117/77  03/31/23 111/72      Physical Exam Vitals and nursing note reviewed. Exam conducted with a chaperone present.  Constitutional:      Appearance: Normal appearance. She is normal weight.  HENT:     Head: Normocephalic and atraumatic.     Right Ear: Tympanic membrane, ear canal and external ear normal.     Left Ear: Tympanic membrane, ear canal and external ear normal.     Nose: Nose normal.     Mouth/Throat:     Mouth: Mucous membranes are moist.     Pharynx: Oropharynx is clear.  Eyes:     Conjunctiva/sclera: Conjunctivae normal.     Pupils: Pupils are equal, round, and reactive to light.  Cardiovascular:     Rate and Rhythm: Normal rate and regular rhythm.     Pulses: Normal pulses.     Heart sounds: Normal heart sounds.  Pulmonary:     Effort: Pulmonary effort is normal.     Breath sounds: Normal breath sounds.  Abdominal:     General: Abdomen is flat. Bowel sounds are normal.  Genitourinary:    General: Normal vulva.     Rectum: Normal.  Skin:    General: Skin is warm.     Capillary Refill:  Capillary refill takes less than 2 seconds.  Neurological:     General: No focal deficit present.  Mental Status: She is alert and oriented to person, place, and time. Mental status is at baseline.  Psychiatric:        Mood and Affect: Mood normal.        Behavior: Behavior normal.        Thought Content: Thought content normal.        Judgment: Judgment normal.     No results found for any visits on 10/06/23. Last CBC Lab Results  Component Value Date   WBC 6.6 10/04/2022   HGB 14.6 10/04/2022   HCT 42.7 10/04/2022   MCV 90 10/04/2022   MCH 30.7 10/04/2022   RDW 12.1 10/04/2022   PLT 175 10/04/2022   Last metabolic panel Lab Results  Component Value Date   GLUCOSE 92 10/04/2022   NA 139 10/04/2022   K 4.3 10/04/2022   CL 103 10/04/2022   CO2 21 10/04/2022   BUN 9 10/04/2022   CREATININE 0.67 10/04/2022   EGFR 118 10/04/2022   CALCIUM 8.6 (L) 10/04/2022   PROT 6.4 10/04/2022   ALBUMIN 3.9 10/04/2022   LABGLOB 2.5 10/04/2022   AGRATIO 1.6 10/04/2022   BILITOT <0.2 10/04/2022   ALKPHOS 93 10/04/2022   AST 13 10/04/2022   ALT 16 10/04/2022   ANIONGAP 5 11/01/2020   Last lipids Lab Results  Component Value Date   CHOL 177 10/04/2022   HDL 49 10/04/2022   LDLCALC 86 10/04/2022   TRIG 252 (H) 10/04/2022   CHOLHDL 3.6 10/04/2022   Last hemoglobin A1c Lab Results  Component Value Date   HGBA1C 5.7 (H) 03/31/2023   Last thyroid  functions Lab Results  Component Value Date   TSH 1.910 03/31/2023   T3TOTAL 132 11/26/2021        Assessment & Plan:    Routine Health Maintenance and Physical Exam  Immunization History  Administered Date(s) Administered   Hep A / Hep B 04/06/2007, 05/05/2007, 10/13/2007   IPV 05/05/2007   Influenza Nasal 05/05/2007, 03/18/2009, 03/16/2010   Influenza, Seasonal, Injecte, Preservative Fre 03/31/2023   Influenza,inj,Quad PF,6+ Mos 07/04/2008, 01/30/2015, 03/06/2019   Influenza-Unspecified 02/13/2008   MMR 05/05/2007    Meningococcal Conjugate 04/06/2007   PPD Test 10/13/2007   Tdap 04/06/2007, 03/31/2023   Typhoid Inactivated 10/13/2007   Yellow Fever 05/05/2007    Health Maintenance  Topic Date Due   Pneumococcal Vaccine 67-78 Years old (1 of 2 - PCV) Never done   COVID-19 Vaccine (1 - 2024-25 season) Never done   Cervical Cancer Screening (HPV/Pap Cotest)  11/29/2023   INFLUENZA VACCINE  12/16/2023   DTaP/Tdap/Td (3 - Td or Tdap) 03/30/2033   Hepatitis C Screening  Completed   HIV Screening  Completed   HPV VACCINES  Aged Out   Meningococcal B Vaccine  Aged Out    Discussed health benefits of physical activity, and encouraged her to engage in regular exercise appropriate for her age and condition.  Problem List Items Addressed This Visit   None Visit Diagnoses       Annual physical exam    -  Primary     Hypothyroidism, unspecified type       Relevant Orders   TSH   T4, free     Impaired glucose tolerance       Relevant Orders   CBC with Differential/Platelet   Comprehensive metabolic panel with GFR   Hemoglobin A1c     Encounter for lipid screening for cardiovascular disease       Relevant Orders  Lipid panel     Screening for cervical cancer       Relevant Orders   IGP,CtNgTv,Apt HPV      Return in about 6 months (around 04/07/2024) for Chronic condition follow up. Screening labs Pap today See in 6 months sooner prn    Manette Section, MD

## 2023-10-06 NOTE — Patient Instructions (Signed)
Minoxidil

## 2023-10-07 ENCOUNTER — Ambulatory Visit: Payer: Self-pay | Admitting: Family Medicine

## 2023-10-07 LAB — LIPID PANEL
Chol/HDL Ratio: 3.6 ratio (ref 0.0–4.4)
Cholesterol, Total: 160 mg/dL (ref 100–199)
HDL: 45 mg/dL (ref >39)
LDL Chol Calc (NIH): 74 mg/dL (ref 0–99)
Triglycerides: 254 mg/dL — ABNORMAL HIGH (ref 0–149)
VLDL Cholesterol Cal: 41 mg/dL — ABNORMAL HIGH (ref 5–40)

## 2023-10-07 LAB — CBC WITH DIFFERENTIAL/PLATELET
Basophils Absolute: 0 10*3/uL (ref 0.0–0.2)
Basos: 1 %
EOS (ABSOLUTE): 0.2 10*3/uL (ref 0.0–0.4)
Eos: 3 %
Hematocrit: 43 % (ref 34.0–46.6)
Hemoglobin: 14.5 g/dL (ref 11.1–15.9)
Immature Grans (Abs): 0 10*3/uL (ref 0.0–0.1)
Immature Granulocytes: 0 %
Lymphocytes Absolute: 3 10*3/uL (ref 0.7–3.1)
Lymphs: 42 %
MCH: 30.7 pg (ref 26.6–33.0)
MCHC: 33.7 g/dL (ref 31.5–35.7)
MCV: 91 fL (ref 79–97)
Monocytes Absolute: 0.5 10*3/uL (ref 0.1–0.9)
Monocytes: 7 %
Neutrophils Absolute: 3.4 10*3/uL (ref 1.4–7.0)
Neutrophils: 47 %
Platelets: 220 10*3/uL (ref 150–450)
RBC: 4.73 x10E6/uL (ref 3.77–5.28)
RDW: 12.9 % (ref 11.7–15.4)
WBC: 7.2 10*3/uL (ref 3.4–10.8)

## 2023-10-07 LAB — HEMOGLOBIN A1C
Est. average glucose Bld gHb Est-mCnc: 108 mg/dL
Hgb A1c MFr Bld: 5.4 % (ref 4.8–5.6)

## 2023-10-07 LAB — COMPREHENSIVE METABOLIC PANEL WITH GFR
ALT: 18 IU/L (ref 0–32)
AST: 15 IU/L (ref 0–40)
Albumin: 4.2 g/dL (ref 3.9–4.9)
Alkaline Phosphatase: 104 IU/L (ref 44–121)
BUN/Creatinine Ratio: 18 (ref 9–23)
BUN: 12 mg/dL (ref 6–20)
Bilirubin Total: 0.3 mg/dL (ref 0.0–1.2)
CO2: 18 mmol/L — ABNORMAL LOW (ref 20–29)
Calcium: 9.3 mg/dL (ref 8.7–10.2)
Chloride: 105 mmol/L (ref 96–106)
Creatinine, Ser: 0.65 mg/dL (ref 0.57–1.00)
Globulin, Total: 2.8 g/dL (ref 1.5–4.5)
Glucose: 92 mg/dL (ref 70–99)
Potassium: 3.9 mmol/L (ref 3.5–5.2)
Sodium: 139 mmol/L (ref 134–144)
Total Protein: 7 g/dL (ref 6.0–8.5)
eGFR: 118 mL/min/{1.73_m2} (ref >59)

## 2023-10-07 LAB — TSH: TSH: 2.75 u[IU]/mL (ref 0.450–4.500)

## 2023-10-07 LAB — T4, FREE: Free T4: 1.21 ng/dL (ref 0.82–1.77)

## 2023-10-13 ENCOUNTER — Telehealth: Payer: Self-pay

## 2023-10-13 NOTE — Telephone Encounter (Signed)
 Copied from CRM [id number]. Topic: Clinical - Lab/Test Results >> Oct 13, 2023 11:34 AM Carlatta H wrote: Reason for CRM: Advised per chart: Your labs were essentially normal including iron levels, kidney/liver function, thyroid  function, and negative for diabetes. Your triglycerides was elevated on your lipid panel most likely from not fasting. The total and LDL was normal so this was essentially good. Next visit with me in 6 months, try to come fasting and I can repeat this.   Patient would like a call with Pap smear results

## 2023-10-14 LAB — IGP,CTNGTV,APT HPV
Chlamydia, Nuc. Acid Amp: NEGATIVE
Gonococcus, Nuc. Acid Amp: NEGATIVE
HPV Aptima: NEGATIVE
PAP Smear Comment: 0
Trich vag by NAA: NEGATIVE

## 2023-11-01 ENCOUNTER — Other Ambulatory Visit: Payer: Self-pay | Admitting: Family Medicine

## 2023-11-01 DIAGNOSIS — E039 Hypothyroidism, unspecified: Secondary | ICD-10-CM

## 2024-02-04 ENCOUNTER — Other Ambulatory Visit: Payer: Self-pay | Admitting: Family Medicine

## 2024-02-04 DIAGNOSIS — E039 Hypothyroidism, unspecified: Secondary | ICD-10-CM

## 2024-04-06 ENCOUNTER — Ambulatory Visit: Admitting: Family Medicine

## 2024-04-06 ENCOUNTER — Encounter: Payer: Self-pay | Admitting: Family Medicine

## 2024-04-06 VITALS — BP 118/77 | HR 104 | Temp 97.6°F | Ht 63.0 in | Wt 151.0 lb

## 2024-04-06 DIAGNOSIS — E039 Hypothyroidism, unspecified: Secondary | ICD-10-CM

## 2024-04-06 NOTE — Progress Notes (Addendum)
 Established Patient Office Visit  Subjective   Patient ID: Isabella Marks, female    DOB: 1986-01-24  Age: 38 y.o. MRN: [id number]  Chief Complaint  Patient presents with   Hypothyroidism    6 month follow up    HPI  Medication compliance: levothyroxine  25 mcg daily Taking medication on empty stomach 30-60 minutes before breakfast:  Endorses  fatigue, cold intolerance and  dry skin. Endorses some depression due to low energy. Does not want to consider treatment.  Pertinent lab results: TSH  2.750 on 10/06/23.  Current medication regimen: will adjust  Well-controlled: has been in the past, will check labs today.  Will adjust dose if indicated once labs is back. Understands if dose is adjusted, she will need to return in 4-6 weeks for recheck.   PHQ-9 score: 10 today Declines treatment for depression, feels this is related to lack of energy related to thyroid .   ROS    Objective:     BP 118/77 (BP Location: Left Arm, Patient Position: Sitting, Cuff Size: Normal)   Pulse (!) 104   Temp 97.6 F (36.4 C) (Oral)   Ht 5' 3 (1.6 m)   Wt 151 lb (68.5 kg)   LMP 02/20/2024 (Approximate)   SpO2 99%   BMI 26.75 kg/m    Physical Exam Vitals and nursing note reviewed.  Constitutional:      General: She is not in acute distress.    Appearance: Normal appearance.  Cardiovascular:     Heart sounds: Normal heart sounds.  Pulmonary:     Effort: Pulmonary effort is normal.     Breath sounds: Normal breath sounds.  Skin:    General: Skin is warm and dry.  Neurological:     General: No focal deficit present.     Mental Status: She is alert. Mental status is at baseline.  Psychiatric:        Mood and Affect: Mood normal.        Behavior: Behavior normal.        Thought Content: Thought content normal.        Judgment: Judgment normal.        04/06/2024    9:27 AM 07/06/2022   10:35 AM 11/26/2021    3:51 PM  Depression screen PHQ 2/9  Decreased Interest 1 2 1   Down,  Depressed, Hopeless 2 2 2   PHQ - 2 Score 3 4 3   Altered sleeping 0 1 3  Tired, decreased energy 3 3 2   Change in appetite 0 0 0  Feeling bad or failure about yourself  2 1 2   Trouble concentrating 0 2 3  Moving slowly or fidgety/restless 2 1 2   Suicidal thoughts 0 0 0  PHQ-9 Score 10 12  15    Difficult doing work/chores Somewhat difficult Somewhat difficult Somewhat difficult     Data saved with a previous flowsheet row definition    No results found for any visits on 04/06/24.    The ASCVD Risk score (Arnett DK, et al., 2019) failed to calculate for the following reasons:   The 2019 ASCVD risk score is only valid for ages 86 to 34    Assessment & Plan:   Problem List Items Addressed This Visit     Hypothyroidism - Primary   Relevant Orders   TSH + free T4  Agrees with plan of care discussed.  Questions answered.   Return in about 3 months (around 07/07/2024) for hypothyroid .    Darice JONELLE Brownie,  FNP

## 2024-04-07 LAB — TSH+FREE T4
Free T4: 1.44 ng/dL (ref 0.82–1.77)
TSH: 2.7 u[IU]/mL (ref 0.450–4.500)

## 2024-04-08 ENCOUNTER — Other Ambulatory Visit: Payer: Self-pay | Admitting: Family Medicine

## 2024-04-08 ENCOUNTER — Ambulatory Visit: Payer: Self-pay | Admitting: Family Medicine

## 2024-04-08 DIAGNOSIS — E039 Hypothyroidism, unspecified: Secondary | ICD-10-CM

## 2024-04-08 MED ORDER — LEVOTHYROXINE SODIUM 25 MCG PO TABS: 25.0000 ug | ORAL_TABLET | Freq: Every day | ORAL | 0 refills | Status: AC

## 2024-05-13 ENCOUNTER — Other Ambulatory Visit: Payer: Self-pay | Admitting: Family Medicine

## 2024-05-13 DIAGNOSIS — E039 Hypothyroidism, unspecified: Secondary | ICD-10-CM

## 2024-07-09 ENCOUNTER — Ambulatory Visit: Admitting: Family Medicine
